# Patient Record
Sex: Female | Born: 1960 | Race: White | Hispanic: No | Marital: Married | Smoking: Never smoker
Health system: Southern US, Community
[De-identification: ages and names within clinical notes are randomized; demographics above are authoritative.]

## PROBLEM LIST (undated history)

## (undated) ENCOUNTER — Emergency Department (HOSPITAL_COMMUNITY): Discharge status: Against Medical Advice | Payer: Self-pay

## (undated) DIAGNOSIS — Z9889 Other specified postprocedural states: Secondary | ICD-10-CM

## (undated) DIAGNOSIS — Z8619 Personal history of other infectious and parasitic diseases: Secondary | ICD-10-CM

## (undated) DIAGNOSIS — R112 Nausea with vomiting, unspecified: Secondary | ICD-10-CM

## (undated) DIAGNOSIS — I10 Essential (primary) hypertension: Secondary | ICD-10-CM

## (undated) DIAGNOSIS — F419 Anxiety disorder, unspecified: Secondary | ICD-10-CM

## (undated) DIAGNOSIS — H8091 Unspecified otosclerosis, right ear: Secondary | ICD-10-CM

## (undated) HISTORY — DX: Anxiety disorder, unspecified: F41.9

## (undated) HISTORY — DX: Unspecified otosclerosis, right ear: H80.91

## (undated) HISTORY — DX: Essential (primary) hypertension: I10

## (undated) HISTORY — PX: TONSILLECTOMY AND ADENOIDECTOMY: SUR1326

## (undated) HISTORY — DX: Personal history of other infectious and parasitic diseases: Z86.19

---

## 1992-06-08 HISTORY — PX: OTHER SURGICAL HISTORY: SHX169

## 1995-06-09 HISTORY — PX: OTHER SURGICAL HISTORY: SHX169

## 1997-06-08 HISTORY — PX: CYSTECTOMY: SUR359

## 1997-09-28 ENCOUNTER — Ambulatory Visit (HOSPITAL_COMMUNITY): Admission: RE | Admit: 1997-09-28 | Discharge: 1997-09-28 | Payer: Self-pay | Admitting: Obstetrics and Gynecology

## 1997-12-12 ENCOUNTER — Observation Stay (HOSPITAL_COMMUNITY): Admission: RE | Admit: 1997-12-12 | Discharge: 1997-12-13 | Payer: Self-pay | Admitting: Obstetrics and Gynecology

## 1998-01-07 ENCOUNTER — Ambulatory Visit (HOSPITAL_COMMUNITY): Admission: RE | Admit: 1998-01-07 | Discharge: 1998-01-07 | Payer: Self-pay | Admitting: Obstetrics and Gynecology

## 1999-07-18 ENCOUNTER — Other Ambulatory Visit: Admission: RE | Admit: 1999-07-18 | Discharge: 1999-07-18 | Payer: Self-pay | Admitting: Obstetrics and Gynecology

## 2000-11-23 ENCOUNTER — Other Ambulatory Visit: Admission: RE | Admit: 2000-11-23 | Discharge: 2000-11-23 | Payer: Self-pay | Admitting: Obstetrics and Gynecology

## 2001-11-28 ENCOUNTER — Other Ambulatory Visit: Admission: RE | Admit: 2001-11-28 | Discharge: 2001-11-28 | Payer: Self-pay | Admitting: Obstetrics and Gynecology

## 2002-06-08 DIAGNOSIS — H8091 Unspecified otosclerosis, right ear: Secondary | ICD-10-CM

## 2002-06-08 HISTORY — DX: Unspecified otosclerosis, right ear: H80.91

## 2002-07-12 ENCOUNTER — Encounter: Payer: Self-pay | Admitting: Emergency Medicine

## 2002-07-12 ENCOUNTER — Emergency Department (HOSPITAL_COMMUNITY): Admission: EM | Admit: 2002-07-12 | Discharge: 2002-07-12 | Payer: Self-pay | Admitting: Internal Medicine

## 2002-12-19 ENCOUNTER — Other Ambulatory Visit: Admission: RE | Admit: 2002-12-19 | Discharge: 2002-12-19 | Payer: Self-pay | Admitting: Obstetrics and Gynecology

## 2004-04-23 ENCOUNTER — Other Ambulatory Visit: Admission: RE | Admit: 2004-04-23 | Discharge: 2004-04-23 | Payer: Self-pay | Admitting: Obstetrics and Gynecology

## 2005-06-22 ENCOUNTER — Other Ambulatory Visit: Admission: RE | Admit: 2005-06-22 | Discharge: 2005-06-22 | Payer: Self-pay | Admitting: Obstetrics and Gynecology

## 2006-06-23 ENCOUNTER — Other Ambulatory Visit: Admission: RE | Admit: 2006-06-23 | Discharge: 2006-06-23 | Payer: Self-pay | Admitting: Obstetrics and Gynecology

## 2007-12-21 ENCOUNTER — Other Ambulatory Visit: Admission: RE | Admit: 2007-12-21 | Discharge: 2007-12-21 | Payer: Self-pay | Admitting: Obstetrics and Gynecology

## 2012-10-07 ENCOUNTER — Encounter: Payer: Self-pay | Admitting: Gynecology

## 2012-10-07 ENCOUNTER — Telehealth: Payer: Self-pay | Admitting: Obstetrics and Gynecology

## 2012-10-07 ENCOUNTER — Encounter: Payer: Self-pay | Admitting: *Deleted

## 2012-10-07 ENCOUNTER — Ambulatory Visit (INDEPENDENT_AMBULATORY_CARE_PROVIDER_SITE_OTHER): Payer: 59 | Admitting: Gynecology

## 2012-10-07 VITALS — BP 118/78 | HR 68 | Resp 16 | Wt 156.6 lb

## 2012-10-07 DIAGNOSIS — N949 Unspecified condition associated with female genital organs and menstrual cycle: Secondary | ICD-10-CM

## 2012-10-07 DIAGNOSIS — Z9889 Other specified postprocedural states: Secondary | ICD-10-CM

## 2012-10-07 DIAGNOSIS — R1032 Left lower quadrant pain: Secondary | ICD-10-CM

## 2012-10-07 DIAGNOSIS — N938 Other specified abnormal uterine and vaginal bleeding: Secondary | ICD-10-CM

## 2012-10-07 DIAGNOSIS — Z86018 Personal history of other benign neoplasm: Secondary | ICD-10-CM | POA: Insufficient documentation

## 2012-10-07 NOTE — Progress Notes (Deleted)
Patient present today for pelvic pain having spotting since last week.

## 2012-10-07 NOTE — Telephone Encounter (Signed)
Call to pt's mobile number. No answer, not able to leave message. aa

## 2012-10-07 NOTE — Telephone Encounter (Signed)
Patient complains of lower left pelvic pain and wants appt.

## 2012-10-07 NOTE — Telephone Encounter (Signed)
Spoke with pt who has been having some lower left quadrant pelvic pain for 4-5 days. Pt on OCP "full time" and occasionally has breakthrough spotting that has been ongoing for her. Pt is a Runner, broadcasting/film/video with tight schedule. Sched OV with CR 10-10-12 at 1045 per pt request.

## 2012-10-07 NOTE — Progress Notes (Signed)
Subjective:     Patient ID: Ariel Nicholson, female   DOB: 1960-11-18, 52 y.o.   MRN: [id number]  HPI Comments: Pt reports left lower quadrant pain, no cramping, reports dull, waxing and waning, nonradiating.  Not sure about precipitating factors.No fever or chills.  Pain has been about a week.  No cange in bowels.  Pt is on Kariva nonstop but has started spotting brownish, painless also about 7d.  Pt con't ocp-kariva- approx 1.5y.  Pt reports BTB approx q88m.  Usually stops 5d, other episodes have not caused this pain.  Pelvic Pain The patient's pertinent negatives include no pelvic pain.     Review of Systems  Genitourinary: Negative for pelvic pain.  All other systems reviewed and are negative.       Objective:   Physical Exam BP 118/78  Pulse 68  Resp 16  Wt 156 lb 9.6 oz (71.033 kg)  LMP 10/07/2012 General appearance: alert, cooperative and appears stated age Abdomen: soft, non-tender; bowel sounds normal; no masses,  no organomegaly Pelvic: cervix normal in appearance, external genitalia normal, no bladder tenderness, no cervical motion tenderness, positive findings: tenderness of left adnexa(e), uterus normal size, shape, and consistency and vagina normal without discharge Minimal fullness on left-unable to reproduce pain/discomfort     Assessment:     LLQ pain, history of dermoid Neg u/a     Plan:     Urine culture plvic u/s to evaluate otc NSAIDS as indicated

## 2012-10-07 NOTE — Telephone Encounter (Signed)
LMTCB. Relayed that I could not leave a message on her cell.  aa

## 2012-10-09 LAB — CULTURE, URINE COMPREHENSIVE: Colony Count: NO GROWTH

## 2012-10-10 ENCOUNTER — Ambulatory Visit: Payer: Self-pay | Admitting: Obstetrics and Gynecology

## 2012-10-19 ENCOUNTER — Ambulatory Visit (INDEPENDENT_AMBULATORY_CARE_PROVIDER_SITE_OTHER): Payer: 59

## 2012-10-19 ENCOUNTER — Ambulatory Visit (INDEPENDENT_AMBULATORY_CARE_PROVIDER_SITE_OTHER): Payer: 59 | Admitting: Obstetrics and Gynecology

## 2012-10-19 ENCOUNTER — Encounter: Payer: Self-pay | Admitting: Obstetrics and Gynecology

## 2012-10-19 VITALS — BP 136/74

## 2012-10-19 DIAGNOSIS — R1032 Left lower quadrant pain: Secondary | ICD-10-CM

## 2012-10-19 DIAGNOSIS — N921 Excessive and frequent menstruation with irregular cycle: Secondary | ICD-10-CM

## 2012-10-19 DIAGNOSIS — D251 Intramural leiomyoma of uterus: Secondary | ICD-10-CM

## 2012-10-19 DIAGNOSIS — Z9889 Other specified postprocedural states: Secondary | ICD-10-CM

## 2012-10-19 DIAGNOSIS — N949 Unspecified condition associated with female genital organs and menstrual cycle: Secondary | ICD-10-CM

## 2012-10-19 DIAGNOSIS — N938 Other specified abnormal uterine and vaginal bleeding: Secondary | ICD-10-CM

## 2012-10-19 DIAGNOSIS — Z86018 Personal history of other benign neoplasm: Secondary | ICD-10-CM

## 2012-10-19 NOTE — Patient Instructions (Signed)
Please review your handout on uterine fibroids.

## 2012-10-19 NOTE — Progress Notes (Signed)
Subjective  Patient here for evaluation of LLQ pain.  History of a dermoid cyst.   Pain occurring off and on.  None today.  Coincided with spotting on OCPs, which is occurring more frequently.  Patient is taking OCPs continuously.  Indication for OCPs is cycle regulation.   Switched to Mircette due to an insurance change.  Skips the last week in the pack. Has menstrual headaches, actually the day prior to menses. Asking when menopause will occur.  Denies other symptoms of constipation, diarrhea, fevers, dysuria, and hematuria.  Objective  See ultrasound below.  2 small intramural fibroids noted.  Normal endometrium and ovaries.   Assessment  Breakthrough bleeding on continuous OCPs.   Two small uterine fibroids. LLQ pain with breakthrough bleeding.  Plan  Will continue with Mircette.  I discussed with patient that doing a withdrawal bleed every 6 weeks may reduce her breakthrough bleeding.  The down side would be a potential menstrual headache for her.   Will take Advil for LLQ pain. If irregular bleeding continues, consider endometrial biopsy. Has an appointment for annual exam with Dr. Tresa Res in July.

## 2013-01-06 ENCOUNTER — Ambulatory Visit (INDEPENDENT_AMBULATORY_CARE_PROVIDER_SITE_OTHER): Payer: 59 | Admitting: Obstetrics and Gynecology

## 2013-01-06 ENCOUNTER — Encounter: Payer: Self-pay | Admitting: Obstetrics and Gynecology

## 2013-01-06 VITALS — BP 122/80 | HR 78 | Resp 20 | Ht 67.0 in | Wt 152.0 lb

## 2013-01-06 DIAGNOSIS — Z0000 Encounter for general adult medical examination without abnormal findings: Secondary | ICD-10-CM

## 2013-01-06 DIAGNOSIS — Z01419 Encounter for gynecological examination (general) (routine) without abnormal findings: Secondary | ICD-10-CM

## 2013-01-06 LAB — POCT URINALYSIS DIPSTICK
Blood, UA: NEGATIVE
Ketones, UA: NEGATIVE
Protein, UA: NEGATIVE
pH, UA: 7

## 2013-01-06 LAB — HEMOGLOBIN, FINGERSTICK: Hemoglobin, fingerstick: 13.6 g/dL (ref 12.0–16.0)

## 2013-01-06 MED ORDER — DESOGESTREL-ETHINYL ESTRADIOL 0.15-0.02/0.01 MG (21/5) PO TABS: 1.0000 | ORAL_TABLET | Freq: Every day | ORAL | Status: DC

## 2013-01-06 MED ORDER — CITALOPRAM HYDROBROMIDE 20 MG PO TABS: 20.0000 mg | ORAL_TABLET | Freq: Every day | ORAL | Status: DC

## 2013-01-06 NOTE — Progress Notes (Signed)
52 y.o.   Married    Caucasian   female   [id number]   here for annual exam.    Patient's last menstrual period was 10/06/2012.          Sexually active: yes  The current method of family planning is vasectomy.    Exercising: walking 2-3 miles 6 days a week Last mammogram:  12/2012 normal Last pap smear: 12/27/09 neg History of abnormal pap: 10+ years ago Smoking: no Alcohol: 5-6 glasses of wine a week Last colonoscopy:never pt aware and will schedule.   Last Bone Density: never  Last tetanus shot:04/2004 Last cholesterol check: 01/04/12 normal  Hgb:  13.6              Urine: neg   Family History  Problem Relation Age of Onset  . Hypertension Mother   . Osteoporosis Mother   . Cancer Mother     pancreatic cancer  . Hypertension Father   . Heart disease Father   . Osteoporosis Maternal Grandmother     Patient Active Problem List   Diagnosis Date Noted  . History of dermoid cyst excision 10/07/2012    Past Medical History  Diagnosis Date  . Anxiety   . 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 Removal of norplant  1997  . Cystectomy  1999    dermiod cyst  . 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 Tonsillectomy and adenoidectomy      Allergies: Bactrim  Current Outpatient Prescriptions  Medication Sig Dispense Refill  . calcium carbonate (OS-CAL) 600 MG TABS Take 600 mg by mouth 2 (two) times daily with a meal.      . citalopram (CELEXA) 20 MG tablet Take 20 mg by mouth daily.      Marland Kitchen desogestrel-ethinyl estradiol (KARIVA,AZURETTE,MIRCETTE) 0.15-0.02/0.01 MG (21/5) tablet Take 1 tablet by mouth daily.      . fexofenadine (ALLEGRA) 180 MG tablet Take 180 mg by mouth as needed.       . fluticasone (FLONASE) 50 MCG/ACT nasal spray Place 2 sprays into the nose as needed.       . Multiple Vitamin (MULTIVITAMIN) capsule Take 1 capsule by mouth daily.       No current  facility-administered medications for this visit.    ROS: Pertinent items are noted in HPI.  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  Myriam Jacobson in [place] doing Psychologist, educational.    Exam:    BP 122/80  Pulse 78  Resp 20  Ht 5\' 7"  (1.702 m)  Wt 152 lb (68.947 kg)  BMI 23.8 kg/m2  LMP 05/01/2014ht and wt stable from last year   Wt Readings from Last 3 Encounters:  01/06/13 152 lb (68.947 kg)  10/07/12 156 lb 9.6 oz (71.033 kg)     Ht Readings from Last 3 Encounters:  01/06/13 5\' 7"  (1.702 m)    General appearance: alert, cooperative and appears stated age Head: Normocephalic, without obvious abnormality, atraumatic Neck: no adenopathy, supple, symmetrical, trachea midline and thyroid not enlarged, symmetric, no tenderness/mass/nodules Lungs: clear to auscultation bilaterally Breasts: Inspection negative, No nipple retraction or dimpling, No nipple discharge or bleeding, No axillary or supraclavicular adenopathy, Normal to palpation without dominant masses Heart: regular rate and rhythm Abdomen: soft, non-tender; bowel sounds normal;  no masses,  no organomegaly Extremities: extremities normal, atraumatic, no cyanosis or edema Skin: Skin color, texture, turgor normal. No rashes or lesions Lymph nodes: Cervical, supraclavicular, and axillary nodes normal. No abnormal inguinal nodes palpated Neurologic: Grossly normal   Pelvic: External genitalia:  no lesions              Urethra:  normal appearing urethra with no masses, tenderness or lesions              Bartholins and Skenes: normal                 Vagina: normal appearing vagina with normal color and discharge, no lesions              Cervix: normal appearance              Pap taken: yes        Bimanual Exam:  Uterus:  uterus is normal size, shape, consistency and nontender                                      Adnexa: normal adnexa in size, nontender and no masses                                       Rectovaginal: Confirms                                      Anus:  normal sphincter tone, no lesions  A: normal gyn exam, vasectomy     On continuous OC's for cycle control     Anxiety     S/p removal of dermoid cyst 1999     P:     mammogram pap smear counseled on breast self exam, mammography screening, adequate intake of calcium and vitamin D, diet and exercise return annually or prn     An After Visit Summary was printed and given to the patient.

## 2013-01-06 NOTE — Patient Instructions (Addendum)

## 2013-03-07 ENCOUNTER — Encounter: Payer: Self-pay | Admitting: Obstetrics and Gynecology

## 2013-04-10 ENCOUNTER — Encounter: Payer: Self-pay | Admitting: Nurse Practitioner

## 2013-04-10 ENCOUNTER — Ambulatory Visit (INDEPENDENT_AMBULATORY_CARE_PROVIDER_SITE_OTHER): Payer: 59 | Admitting: Nurse Practitioner

## 2013-04-10 ENCOUNTER — Telehealth: Payer: Self-pay | Admitting: Nurse Practitioner

## 2013-04-10 ENCOUNTER — Other Ambulatory Visit: Payer: Self-pay | Admitting: Internal Medicine

## 2013-04-10 VITALS — BP 140/92 | HR 76 | Ht 67.0 in | Wt 152.0 lb

## 2013-04-10 DIAGNOSIS — R109 Unspecified abdominal pain: Secondary | ICD-10-CM

## 2013-04-10 DIAGNOSIS — N951 Menopausal and female climacteric states: Secondary | ICD-10-CM

## 2013-04-10 MED ORDER — DESOGESTREL-ETHINYL ESTRADIOL 0.15-0.02/0.01 MG (21/5) PO TABS: 1.0000 | ORAL_TABLET | Freq: Every day | ORAL | Status: DC

## 2013-04-10 MED ORDER — MEDROXYPROGESTERONE ACETATE 10 MG PO TABS: 10.0000 mg | ORAL_TABLET | Freq: Every day | ORAL | Status: DC

## 2013-04-10 MED ORDER — ALPRAZOLAM 0.25 MG PO TABS: 0.2500 mg | ORAL_TABLET | Freq: Every evening | ORAL | Status: DC | PRN

## 2013-04-10 NOTE — Telephone Encounter (Signed)
Spoke with pt about anxiety, crying spells, hot flashes, joint pain and swelling, hair loss, slight dizziness, and occasional nausea. Pt has recently seen her PCP Dr. Eric Form who did a physical with EKG. No significant findings by PCP. Pt is most bothered by the anxiety. Pt off work today. Sched OV with PG at 4:15.

## 2013-04-10 NOTE — Progress Notes (Signed)
Patient ID: Ariel Nicholson, female   DOB: [birth year], 52 y.o.   MRN: [id number]  S:    52 yo WM Fe presents for a consult visit to discuss menopausal symptoms.  LMP 01/2013 after stopping continuous  active OCP.  Since then no cycle but she is having a lot of symptoms.  They include vaso symptoms, insomnia, brain fog, edgy, tearful, vaginal dryness and decreased libido.  The past 3-4 weeks have been worse with feeling  super anxious.  She had right chest wall pain and saw PCP.  Routine CXR was normal but she is going to do other evaluation for GB disease on Wednesday.   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  Other constitutional symptoms include nausea and dizzy at times.  She feels overwhelmed with her symptoms and wants to restart back on her OCP or do something!   O: Alert, oriented with good control of speech and voices her concerns.  When discussion of emotional symptoms she is tearful.  Denies any other stressors or family issues. No history of underlying depression.  Denies any thoughts of harm to herself or others.  She did take a piece of her husbands Xanax yesterday with some relief of anxiety.    A: Perimenopausal symptoms off OCP  AEX current on 01/06/13 with Dr. Arline Asp Romine  Mammogram normal on 12/27/12  Plan: Long discussion about peri and menopausal symptoms and effect on the body.  Booklet on Menopause is also provided.  At length discussed WHI study with potential side effects and risks of HRT.  She would like to proceed with Provera challenge to initiate a cycle then back on OCP - Mircette.  She is aware that if withdrawal bleeding is prolonged or if no bleeding to call us back.   We have spent time with discussion about all her concerns and she is given support.  Will follow as she goes through this process of restarting.  She is getting BP  monitored at PCP and it has been normal until today.  Will check with PCP on Wednesday as well.  She is given as short term RX for  Xanax 0.25 to take HS prn # 30 / no refills.  She is aware that no other RX for this will be given and if still needed will be done by PCP.

## 2013-04-10 NOTE — Patient Instructions (Signed)

## 2013-04-10 NOTE — Telephone Encounter (Signed)
Chief Complaint  Patient presents with   Appointment  pt is having some menopausal issues and would like to see Patty if possible.

## 2013-04-12 ENCOUNTER — Ambulatory Visit
Admission: RE | Admit: 2013-04-12 | Discharge: 2013-04-12 | Disposition: A | Discharge status: Home / Self Care | Payer: 59 | Source: Ambulatory Visit | Attending: Internal Medicine | Admitting: Internal Medicine

## 2013-04-12 DIAGNOSIS — R109 Unspecified abdominal pain: Secondary | ICD-10-CM

## 2013-04-12 NOTE — Progress Notes (Signed)
Encounter reviewed by Dr. Analiese Krupka Silva.  

## 2013-04-13 ENCOUNTER — Other Ambulatory Visit (HOSPITAL_COMMUNITY): Payer: Self-pay | Admitting: Internal Medicine

## 2013-04-13 DIAGNOSIS — R1011 Right upper quadrant pain: Secondary | ICD-10-CM

## 2013-04-20 ENCOUNTER — Other Ambulatory Visit (HOSPITAL_COMMUNITY): Payer: Self-pay | Admitting: Internal Medicine

## 2013-04-20 DIAGNOSIS — K8050 Calculus of bile duct without cholangitis or cholecystitis without obstruction: Secondary | ICD-10-CM

## 2013-04-20 DIAGNOSIS — R1011 Right upper quadrant pain: Secondary | ICD-10-CM

## 2013-04-21 ENCOUNTER — Encounter (HOSPITAL_COMMUNITY)
Admission: RE | Admit: 2013-04-21 | Discharge: 2013-04-21 | Disposition: A | Discharge status: Home / Self Care | Payer: 59 | Source: Ambulatory Visit | Attending: Internal Medicine | Admitting: Internal Medicine

## 2013-04-21 DIAGNOSIS — K8050 Calculus of bile duct without cholangitis or cholecystitis without obstruction: Secondary | ICD-10-CM

## 2013-04-21 DIAGNOSIS — K828 Other specified diseases of gallbladder: Secondary | ICD-10-CM | POA: Insufficient documentation

## 2013-04-21 DIAGNOSIS — R1011 Right upper quadrant pain: Secondary | ICD-10-CM | POA: Insufficient documentation

## 2013-04-21 MED ORDER — TECHNETIUM TC 99M MEBROFENIN IV KIT
5.0000 | PACK | Freq: Once | INTRAVENOUS | Status: AC | PRN
  Administered 2013-04-21: 5 via INTRAVENOUS

## 2013-04-21 MED ORDER — SINCALIDE 5 MCG IJ SOLR
INTRAMUSCULAR | Status: AC
  Administered 2013-04-21: 1.4 ug
  Filled 2013-04-21: qty 5

## 2013-04-21 MED ORDER — SINCALIDE 5 MCG IJ SOLR: 1.4000 ug | Freq: Once | INTRAMUSCULAR | Status: DC

## 2013-05-03 ENCOUNTER — Ambulatory Visit (INDEPENDENT_AMBULATORY_CARE_PROVIDER_SITE_OTHER): Payer: 59 | Admitting: Surgery

## 2013-05-03 ENCOUNTER — Telehealth: Payer: Self-pay | Admitting: Nurse Practitioner

## 2013-05-03 ENCOUNTER — Encounter (HOSPITAL_COMMUNITY)
Admission: RE | Admit: 2013-05-03 | Discharge: 2013-05-03 | Disposition: A | Discharge status: Home / Self Care | Payer: 59 | Source: Ambulatory Visit | Attending: Surgery | Admitting: Surgery

## 2013-05-03 ENCOUNTER — Encounter (HOSPITAL_COMMUNITY): Payer: Self-pay

## 2013-05-03 ENCOUNTER — Encounter (INDEPENDENT_AMBULATORY_CARE_PROVIDER_SITE_OTHER): Payer: Self-pay | Admitting: Surgery

## 2013-05-03 ENCOUNTER — Other Ambulatory Visit (HOSPITAL_COMMUNITY): Payer: Self-pay | Admitting: *Deleted

## 2013-05-03 VITALS — BP 128/78 | HR 72 | Temp 97.2°F | Resp 14 | Ht 67.0 in | Wt 150.0 lb

## 2013-05-03 DIAGNOSIS — K824 Cholesterolosis of gallbladder: Secondary | ICD-10-CM | POA: Insufficient documentation

## 2013-05-03 DIAGNOSIS — Z01818 Encounter for other preprocedural examination: Secondary | ICD-10-CM | POA: Insufficient documentation

## 2013-05-03 DIAGNOSIS — K828 Other specified diseases of gallbladder: Secondary | ICD-10-CM

## 2013-05-03 DIAGNOSIS — Z01812 Encounter for preprocedural laboratory examination: Secondary | ICD-10-CM | POA: Insufficient documentation

## 2013-05-03 HISTORY — DX: Other specified postprocedural states: Z98.890

## 2013-05-03 HISTORY — DX: Other specified postprocedural states: R11.2

## 2013-05-03 LAB — CBC
HCT: 41.3 % (ref 36.0–46.0)
MCH: 29.8 pg (ref 26.0–34.0)
MCHC: 33.4 g/dL (ref 30.0–36.0)
Platelets: 239 10*3/uL (ref 150–400)

## 2013-05-03 LAB — HCG, SERUM, QUALITATIVE: Preg, Serum: NEGATIVE

## 2013-05-03 NOTE — Patient Instructions (Signed)
20      Your procedure is scheduled on:  Friday 05/12/2013  Report to [facility] at  1245  AM.  Call this number if you have problems the night before or morning of surgery: [phone]   Remember:             IF YOU USE CPAP,BRING MASK AND TUBING AM OF SURGERY!             IF YOU DO NOT HAVE YOUR TYPE AND SCREEN DRAWN AT PRE-ADMIT APPOINTMENT, YOU WILL HAVE IT DRAWN AM OF SURGERY!   Do not eat food AFTER MIDNIGHT! MAY HAVE CLEAR LIQUIDS FROM MIDNIGHT UP UNTIL 0915 AM THEN NOTHING UNTIL AFTER SURGERY!  Take these medicines the morning of surgery with A SIP OF WATER: may use Flonase nasal spray, allegra if needed    [facility] IS NOT RESPONSIBLE FOR ANY BELONGINGS OR VALUABLES BROUGHT TO HOSPITAL.  Marland Kitchen  Leave suitcase in the car. After surgery it may be brought to your room.  For patients admitted to the hospital, checkout time is 11:00 AM the day of              Discharge.    DO NOT WEAR JEWELRY,MAKE-UP,LOTIONS,POWDERS,PERFUMES,CONTACTS , DENTURES OR BRIDGEWORK ,AND DO NOT WEAR FALSE EYELASHES                                    Patients discharged the day of surgery will not be allowed to drive home. If going home the same day of surgery, must have someone stay with you first 24 hrs.at home and arrange for someone to drive you home from the Hospital.                         YOUR DRIVER IS:   Special Instructions:              Please read over the following fact sheets that you were given:             1. [name]              2.INCENTIVE SPIROMETRY                                        [name],RN,BSN     [phone]                FAILURE TO FOLLOW THESE INSTRUCTIONS MAY RESULT IN  CANCELLATION OF YOUR SURGERY!               Patient Signature:___________________________

## 2013-05-03 NOTE — Patient Instructions (Signed)
  [facility]  LAPAROSCOPIC SURGERY - POST-OP INSTRUCTIONS  Always review your discharge instruction sheet given to you by the facility where your surgery was performed.  A prescription for pain medication may be given to you upon discharge.  Take your pain medication as prescribed.  If narcotic pain medicine is not needed, then you may take acetaminophen (Tylenol) or ibuprofen (Advil) as needed.  Take your usually prescribed medications unless otherwise directed.  If you need a refill on your pain medication, please contact your pharmacy.  They will contact our office to request authorization. Prescriptions will not be filled after 5 P.M. or on weekends.  You should follow a light diet the first few days after arrival home, such as soup and crackers or toast.  Be sure to include plenty of fluids daily.  Most patients will experience some swelling and bruising in the area of the incisions.  Ice packs will help.  Swelling and bruising can take several days to resolve.   It is common to experience some constipation if taking pain medication after surgery.  Increasing fluid intake and taking a stool softener (such as Colace) will usually help or prevent this problem from occurring.  A mild laxative (Milk of Magnesia or Miralax) should be taken according to package instructions if there are no bowel movements after 48 hours.  Unless discharge instructions indicate otherwise, you may remove your bandages 24-48 hours after surgery, and you may shower at that time.  You may have steri-strips (small skin tapes) in place directly over the incision.  These strips should be left on the skin for 7-10 days.  If your surgeon used skin glue on the incision, you may shower in 24 hours.  The glue will flake off over the next 2-3 weeks.  Any sutures or staples will be removed at the office during your follow-up visit.  ACTIVITIES:  You may resume regular (light) daily activities beginning the  next day-such as daily self-care, walking, climbing stairs-gradually increasing activities as tolerated.  You may have sexual intercourse when it is comfortable.  Refrain from any heavy lifting or straining until approved by your doctor.  You may drive when you are no longer taking prescription pain medication, you can comfortably wear a seatbelt, and you can safely maneuver your car and apply brakes.  You should see your doctor in the office for a follow-up appointment approximately 2-3 weeks after your surgery.  Make sure that you call for this appointment within a day or two after you arrive home to insure a convenient appointment time.  WHEN TO CALL YOUR DOCTOR: 1. Fever over 101.0 2. Inability to urinate 3. Continued bleeding from incision 4. Increased pain, redness, or drainage from the incision 5. Increasing abdominal pain  The clinic staff is available to answer your questions during regular business hours.  Please don't hesitate to call and ask to speak to one of the nurses for clinical concerns.  If you have a medical emergency, go to the nearest emergency room or call 911.  A surgeon from [facility] is always on call for the hospital.  Ariel Fok M. Reniah Cottingham, MD, FACS [facility] Office: [phone] Toll Free:  [phone] FAX [phone]  Web site: [email or web address] 

## 2013-05-03 NOTE — Progress Notes (Signed)
General Surgery - [facility]  Chief Complaint  Patient presents with  . New Evaluation    biliary dyskinesia, gallbladder polyp - referral from Dr. Doug Shaw, [facility]    HISTORY: Patient is a 52-year-old female referred by her primary care physician for evaluation for cholecystectomy for newly diagnosed gallbladder polyp and biliary dyskinesia.  Patient began having right upper quadrant abdominal pain in the late summer. She had an initial workup for pleuritic chest pain. Symptoms failed to resolve. An ultrasound showed an 8 mm gallbladder polyp but no gallstones. A nuclear medicine hepatobiliary scan was obtained which showed an abnormally low gallbladder ejection fraction of 1.6% on administration of cholecystokinin. Patient is now referred for evaluation for her gallbladder polyp and for biliary dyskinesia.  Patient experiences pain generally daily. She denies any significant nausea and has had no emesis. She denies fevers or chills. She denies jaundice or acholic stools. Her only prior abdominal surgery was for ovarian cyst. She denies any previous hepatobiliary or pancreatic disease.  Past Medical History  Diagnosis Date  . Anxiety   . 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 ALPRAZolam (XANAX) 0.25 MG tablet Take 1 tablet (0.25 mg total) by mouth at bedtime as needed for sleep.  30 tablet  0  . calcium carbonate (OS-CAL) 600 MG TABS Take 600 mg by mouth 2 (two) times daily with a meal.      . citalopram (CELEXA) 20 MG tablet Take 1 tablet (20 mg total) by mouth daily.  90 tablet  3  . diphenhydrAMINE (BENADRYL) 25 MG tablet Take 25 mg by mouth every 6 (six) hours as needed.      . fexofenadine (ALLEGRA) 180 MG tablet Take 180 mg by mouth as needed.       . fluticasone (FLONASE) 50 MCG/ACT nasal spray Place 2 sprays into the nose as  needed.       . Multiple Vitamin (MULTIVITAMIN) capsule Take 1 capsule by mouth daily.      . naproxen sodium (ANAPROX) 220 MG tablet Take 220 mg by mouth 2 (two) times daily with a meal.       No current facility-administered medications for this visit.    Allergies  Allergen Reactions  . Bactrim [Sulfamethoxazole-Trimethoprim] Hives    Family History  Problem Relation Age of Onset  . Hypertension Mother   . Osteoporosis Mother   . Cancer Mother     pancreatic cancer  . Hypertension Father   . Heart disease Father   . Osteoporosis Maternal Grandmother   . Cancer Maternal Grandfather     colon    History   Social History  . Marital Status: Married    Spouse Name: N/A    Number of Children: N/A  . Years of Education: N/A   Social History Main Topics  . Smoking status: Never Smoker   . Smokeless tobacco: Never Used  . Alcohol Use: 3.0 oz/week    6 drink(s) per week     Comment: 5-6 glasses of wine a week  . Drug Use: No  . Sexual Activity: Yes    Partners: Male    Birth Control/ Protection: Surgical     Comment: Husband with vasectomy   Other Topics Concern  . None   Social History Narrative  . None    REVIEW OF SYSTEMS -   PERTINENT POSITIVES ONLY: Intermittent right upper quadrant abdominal pain. Denies jaundice. Denies acholic stools.  EXAM: Filed Vitals:   05/03/13 0937  BP: 128/78  Pulse: 72  Temp: 97.2 F (36.2 C)  Resp: 14    HEENT: normocephalic; pupils equal and reactive; sclerae clear; dentition good; mucous membranes moist NECK:  symmetric on extension; no palpable anterior or posterior cervical lymphadenopathy; no supraclavicular masses; no tenderness CHEST: clear to auscultation bilaterally without rales, rhonchi, or wheezes CARDIAC: regular rate and rhythm without significant murmur; peripheral pulses are full ABDOMEN: soft without distension; bowel sounds present; no mass; no hepatosplenomegaly; no hernia EXT:  non-tender without edema;  no deformity NEURO: no gross focal deficits; no sign of tremor   LABORATORY RESULTS: See [facility] (CHL-Epic) for most recent results  RADIOLOGY RESULTS: See [facility] (CHL-Epic) for most recent results  IMPRESSION: #1 gallbladder polyp, 8 mm #2 biliary dyskinesia, intermittent abdominal pain  PLAN: I had a lengthy discussion with the patient and her husband. We discussed the above diagnoses at length. We reviewed her test results in detail. I provided him with written literature to review at home.  We discussed options for management. I have recommended laparoscopic cholecystectomy. We discussed the procedure at length as well as the potential complications. We discussed the hospital stay and her recovery following surgery. She and her husband understand and wish to proceed in the near future.  The risks and benefits of the procedure have been discussed at length with the patient.  The patient understands the proposed procedure, potential alternative treatments, and the course of recovery to be expected.  All of the patient's questions have been answered at this time.  The patient wishes to proceed with surgery.  Dartha Rozzell M. Rashed Edler, MD, FACS General & Endocrine Surgery [facility]  Primary Care Physician: SHAW,W DOUGLAS, MD   

## 2013-05-03 NOTE — Telephone Encounter (Signed)
LMTCB cm 

## 2013-05-03 NOTE — Telephone Encounter (Signed)
Patient was on provera for 10 days Ariel Nicholson told her to call after two weeks of being off of it to let her know if she started bleeding or not. She has NOT started bleeding.

## 2013-05-03 NOTE — Progress Notes (Signed)
Called patient's Primary Care Physician ,Dr. Clelia Croft and informed him of patient's BP at PST appointment today-160/118,173/113,176/105. Consulted Dr. Okey Dupre from Anesthesia and he wanted me to relate to PCP about patient's BP and Anesthesia would not do elective surgery on a patient with a BP like that and if any questions to call him.

## 2013-05-04 NOTE — Progress Notes (Signed)
Quick Note:  These results are acceptable for scheduled surgery.  Milam Allbaugh M. Taaj Hurlbut, MD, FACS [facility] Office: [phone]   ______ 

## 2013-05-09 ENCOUNTER — Other Ambulatory Visit: Payer: Self-pay | Admitting: Nurse Practitioner

## 2013-05-09 DIAGNOSIS — N912 Amenorrhea, unspecified: Secondary | ICD-10-CM

## 2013-05-09 NOTE — Telephone Encounter (Signed)
Have her schedule for [facility] at a month past her last dose of Provera.  Order is placed in epic.

## 2013-05-12 ENCOUNTER — Ambulatory Visit (HOSPITAL_COMMUNITY): Payer: 59

## 2013-05-12 ENCOUNTER — Encounter (HOSPITAL_COMMUNITY): Payer: Self-pay | Admitting: *Deleted

## 2013-05-12 ENCOUNTER — Encounter (HOSPITAL_COMMUNITY): Payer: 59 | Admitting: Anesthesiology

## 2013-05-12 ENCOUNTER — Ambulatory Visit (HOSPITAL_COMMUNITY): Payer: 59 | Admitting: Anesthesiology

## 2013-05-12 ENCOUNTER — Observation Stay (HOSPITAL_COMMUNITY)
Admission: RE | Admit: 2013-05-12 | Discharge: 2013-05-13 | Disposition: A | Discharge status: Home / Self Care | Payer: 59 | Source: Ambulatory Visit | Attending: Surgery | Admitting: Surgery

## 2013-05-12 ENCOUNTER — Encounter (HOSPITAL_COMMUNITY)
Admission: RE | Disposition: A | Discharge status: Home / Self Care | Payer: Self-pay | Source: Ambulatory Visit | Attending: Surgery

## 2013-05-12 DIAGNOSIS — Z79899 Other long term (current) drug therapy: Secondary | ICD-10-CM | POA: Insufficient documentation

## 2013-05-12 DIAGNOSIS — K811 Chronic cholecystitis: Secondary | ICD-10-CM

## 2013-05-12 DIAGNOSIS — K828 Other specified diseases of gallbladder: Secondary | ICD-10-CM | POA: Diagnosis present

## 2013-05-12 DIAGNOSIS — K824 Cholesterolosis of gallbladder: Secondary | ICD-10-CM

## 2013-05-12 HISTORY — PX: CHOLECYSTECTOMY: SHX55

## 2013-05-12 SURGERY — LAPAROSCOPIC CHOLECYSTECTOMY WITH INTRAOPERATIVE CHOLANGIOGRAM
Anesthesia: General | Site: Abdomen

## 2013-05-12 MED ORDER — ROCURONIUM BROMIDE 100 MG/10ML IV SOLN
INTRAVENOUS | Status: AC
  Filled 2013-05-12: qty 1

## 2013-05-12 MED ORDER — FENTANYL CITRATE 0.05 MG/ML IJ SOLN
INTRAMUSCULAR | Status: DC | PRN
  Administered 2013-05-12: 100 ug via INTRAVENOUS
  Administered 2013-05-12: 50 ug via INTRAVENOUS
  Administered 2013-05-12 (×2): 25 ug via INTRAVENOUS

## 2013-05-12 MED ORDER — GLYCOPYRROLATE 0.2 MG/ML IJ SOLN
INTRAMUSCULAR | Status: AC
  Filled 2013-05-12: qty 3

## 2013-05-12 MED ORDER — METOCLOPRAMIDE HCL 5 MG/ML IJ SOLN
INTRAMUSCULAR | Status: AC
  Filled 2013-05-12: qty 2

## 2013-05-12 MED ORDER — ONDANSETRON HCL 4 MG/2ML IJ SOLN: 4.0000 mg | Freq: Four times a day (QID) | INTRAMUSCULAR | Status: DC | PRN

## 2013-05-12 MED ORDER — IOHEXOL 300 MG/ML  SOLN
INTRAMUSCULAR | Status: DC | PRN
  Administered 2013-05-12: 6 mL via INTRAVENOUS

## 2013-05-12 MED ORDER — ONDANSETRON HCL 4 MG/2ML IJ SOLN
INTRAMUSCULAR | Status: AC
  Filled 2013-05-12: qty 2

## 2013-05-12 MED ORDER — ONDANSETRON HCL 4 MG/2ML IJ SOLN
INTRAMUSCULAR | Status: DC | PRN
  Administered 2013-05-12: 4 mg via INTRAVENOUS

## 2013-05-12 MED ORDER — DEXAMETHASONE SODIUM PHOSPHATE 10 MG/ML IJ SOLN
INTRAMUSCULAR | Status: AC
  Filled 2013-05-12: qty 1

## 2013-05-12 MED ORDER — LIDOCAINE HCL (CARDIAC) 20 MG/ML IV SOLN
INTRAVENOUS | Status: DC | PRN
  Administered 2013-05-12: 50 mg via INTRAVENOUS

## 2013-05-12 MED ORDER — PROPOFOL 10 MG/ML IV BOLUS
INTRAVENOUS | Status: DC | PRN
  Administered 2013-05-12: 170 mg via INTRAVENOUS

## 2013-05-12 MED ORDER — BUPIVACAINE-EPINEPHRINE 0.25% -1:200000 IJ SOLN
INTRAMUSCULAR | Status: DC | PRN
  Administered 2013-05-12 (×2): 10 mL

## 2013-05-12 MED ORDER — ROCURONIUM BROMIDE 100 MG/10ML IV SOLN
INTRAVENOUS | Status: DC | PRN
  Administered 2013-05-12: 50 mg via INTRAVENOUS
  Administered 2013-05-12: 10 mg via INTRAVENOUS

## 2013-05-12 MED ORDER — KCL IN DEXTROSE-NACL 20-5-0.45 MEQ/L-%-% IV SOLN
INTRAVENOUS | Status: DC
  Administered 2013-05-12: 16:00:00 via INTRAVENOUS
  Filled 2013-05-12 (×2): qty 1000

## 2013-05-12 MED ORDER — LACTATED RINGERS IV SOLN
INTRAVENOUS | Status: DC
  Administered 2013-05-12: 1000 mL via INTRAVENOUS
  Administered 2013-05-12: 13:00:00 via INTRAVENOUS

## 2013-05-12 MED ORDER — DEXAMETHASONE SODIUM PHOSPHATE 10 MG/ML IJ SOLN
INTRAMUSCULAR | Status: DC | PRN
  Administered 2013-05-12: 10 mg via INTRAVENOUS

## 2013-05-12 MED ORDER — CITALOPRAM HYDROBROMIDE 20 MG PO TABS
20.0000 mg | ORAL_TABLET | Freq: Every day | ORAL | Status: DC
  Administered 2013-05-12: 20 mg via ORAL
  Filled 2013-05-12 (×2): qty 1

## 2013-05-12 MED ORDER — HYDROMORPHONE HCL PF 1 MG/ML IJ SOLN
0.2500 mg | INTRAMUSCULAR | Status: DC | PRN
  Administered 2013-05-12 (×2): 0.5 mg via INTRAVENOUS

## 2013-05-12 MED ORDER — BUPIVACAINE-EPINEPHRINE 0.25% -1:200000 IJ SOLN
INTRAMUSCULAR | Status: AC
  Filled 2013-05-12: qty 1

## 2013-05-12 MED ORDER — NEOSTIGMINE METHYLSULFATE 1 MG/ML IJ SOLN
INTRAMUSCULAR | Status: AC
  Filled 2013-05-12: qty 10

## 2013-05-12 MED ORDER — PROPOFOL 10 MG/ML IV BOLUS
INTRAVENOUS | Status: AC
  Filled 2013-05-12: qty 20

## 2013-05-12 MED ORDER — ALPRAZOLAM 0.25 MG PO TABS: 0.2500 mg | ORAL_TABLET | Freq: Every evening | ORAL | Status: DC | PRN

## 2013-05-12 MED ORDER — CEFAZOLIN SODIUM-DEXTROSE 2-3 GM-% IV SOLR
2.0000 g | INTRAVENOUS | Status: AC
  Administered 2013-05-12: 2 g via INTRAVENOUS

## 2013-05-12 MED ORDER — EPHEDRINE SULFATE 50 MG/ML IJ SOLN
INTRAMUSCULAR | Status: DC | PRN
  Administered 2013-05-12: 7.5 mg via INTRAVENOUS

## 2013-05-12 MED ORDER — MIDAZOLAM HCL 5 MG/5ML IJ SOLN
INTRAMUSCULAR | Status: DC | PRN
  Administered 2013-05-12: 2 mg via INTRAVENOUS

## 2013-05-12 MED ORDER — NEOSTIGMINE METHYLSULFATE 1 MG/ML IJ SOLN
INTRAMUSCULAR | Status: DC | PRN
  Administered 2013-05-12: 4 mg via INTRAVENOUS

## 2013-05-12 MED ORDER — GLYCOPYRROLATE 0.2 MG/ML IJ SOLN
INTRAMUSCULAR | Status: DC | PRN
  Administered 2013-05-12: .6 mg via INTRAVENOUS

## 2013-05-12 MED ORDER — MIDAZOLAM HCL 2 MG/2ML IJ SOLN
INTRAMUSCULAR | Status: AC
  Filled 2013-05-12: qty 2

## 2013-05-12 MED ORDER — METOCLOPRAMIDE HCL 5 MG/ML IJ SOLN
INTRAMUSCULAR | Status: DC | PRN
  Administered 2013-05-12: 10 mg via INTRAVENOUS

## 2013-05-12 MED ORDER — FENTANYL CITRATE 0.05 MG/ML IJ SOLN
INTRAMUSCULAR | Status: AC
  Filled 2013-05-12: qty 2

## 2013-05-12 MED ORDER — LACTATED RINGERS IV SOLN: INTRAVENOUS | Status: DC

## 2013-05-12 MED ORDER — AMLODIPINE BESYLATE 5 MG PO TABS
5.0000 mg | ORAL_TABLET | Freq: Every day | ORAL | Status: DC
  Filled 2013-05-12: qty 1

## 2013-05-12 MED ORDER — ACETAMINOPHEN 325 MG PO TABS: 650.0000 mg | ORAL_TABLET | ORAL | Status: DC | PRN

## 2013-05-12 MED ORDER — HYDROMORPHONE HCL PF 1 MG/ML IJ SOLN
1.0000 mg | INTRAMUSCULAR | Status: DC | PRN
  Administered 2013-05-12: 1 mg via INTRAVENOUS
  Filled 2013-05-12: qty 1

## 2013-05-12 MED ORDER — CEFAZOLIN SODIUM-DEXTROSE 2-3 GM-% IV SOLR
INTRAVENOUS | Status: AC
  Filled 2013-05-12: qty 50

## 2013-05-12 MED ORDER — ONDANSETRON HCL 4 MG PO TABS: 4.0000 mg | ORAL_TABLET | Freq: Four times a day (QID) | ORAL | Status: DC | PRN

## 2013-05-12 MED ORDER — HYDROCODONE-ACETAMINOPHEN 5-325 MG PO TABS
1.0000 | ORAL_TABLET | ORAL | Status: DC | PRN
  Administered 2013-05-12: 2 via ORAL
  Filled 2013-05-12: qty 2

## 2013-05-12 MED ORDER — HYDROMORPHONE HCL PF 1 MG/ML IJ SOLN
INTRAMUSCULAR | Status: AC
  Filled 2013-05-12: qty 1

## 2013-05-12 SURGICAL SUPPLY — 36 items
APL SKNCLS STERI-STRIP NONHPOA (GAUZE/BANDAGES/DRESSINGS) ×1
APPLIER CLIP ROT 10 11.4 M/L (STAPLE) ×2
APR CLP MED LRG 11.4X10 (STAPLE) ×1
BAG SPEC RTRVL LRG 6X4 10 (ENDOMECHANICALS) ×1
BENZOIN TINCTURE PRP APPL 2/3 (GAUZE/BANDAGES/DRESSINGS) ×2 IMPLANT
CABLE HIGH FREQUENCY MONO STRZ (ELECTRODE) ×2 IMPLANT
CANISTER SUCTION 2500CC (MISCELLANEOUS) ×2 IMPLANT
CHLORAPREP W/TINT 26ML (MISCELLANEOUS) ×2 IMPLANT
CLIP APPLIE ROT 10 11.4 M/L (STAPLE) ×1 IMPLANT
COVER MAYO STAND STRL (DRAPES) ×3 IMPLANT
DECANTER SPIKE VIAL GLASS SM (MISCELLANEOUS) ×2 IMPLANT
DRAPE C-ARM 42X120 X-RAY (DRAPES) ×2 IMPLANT
DRAPE LAPAROSCOPIC ABDOMINAL (DRAPES) ×2 IMPLANT
DRAPE UTILITY XL STRL (DRAPES) ×2 IMPLANT
ELECT REM PT RETURN 9FT ADLT (ELECTROSURGICAL) ×2
ELECTRODE REM PT RTRN 9FT ADLT (ELECTROSURGICAL) ×1 IMPLANT
GLOVE SURG ORTHO 8.0 STRL STRW (GLOVE) ×2 IMPLANT
GOWN STRL REIN XL XLG (GOWN DISPOSABLE) ×5 IMPLANT
HEMOSTAT SURGICEL 4X8 (HEMOSTASIS) IMPLANT
KIT BASIN OR (CUSTOM PROCEDURE TRAY) ×2 IMPLANT
NS IRRIG 1000ML POUR BTL (IV SOLUTION) ×2 IMPLANT
POUCH SPECIMEN RETRIEVAL 10MM (ENDOMECHANICALS) ×2 IMPLANT
SCISSORS LAP 5X35 DISP (ENDOMECHANICALS) ×2 IMPLANT
SET CHOLANGIOGRAPH MIX (MISCELLANEOUS) ×2 IMPLANT
SET IRRIG TUBING LAPAROSCOPIC (IRRIGATION / IRRIGATOR) ×2 IMPLANT
SLEEVE XCEL OPT CAN 5 100 (ENDOMECHANICALS) ×2 IMPLANT
SOLUTION ANTI FOG 6CC (MISCELLANEOUS) ×2 IMPLANT
STRIP CLOSURE SKIN 1/2X4 (GAUZE/BANDAGES/DRESSINGS) ×2 IMPLANT
SUT MNCRL AB 4-0 PS2 18 (SUTURE) ×2 IMPLANT
TOWEL OR 17X26 10 PK STRL BLUE (TOWEL DISPOSABLE) ×2 IMPLANT
TOWEL OR NON WOVEN STRL DISP B (DISPOSABLE) ×2 IMPLANT
TRAY LAP CHOLE (CUSTOM PROCEDURE TRAY) ×2 IMPLANT
TROCAR BLADELESS OPT 5 100 (ENDOMECHANICALS) ×3 IMPLANT
TROCAR XCEL BLUNT TIP 100MML (ENDOMECHANICALS) ×2 IMPLANT
TROCAR XCEL NON-BLD 11X100MML (ENDOMECHANICALS) ×2 IMPLANT
TUBING INSUFFLATION 10FT LAP (TUBING) ×2 IMPLANT

## 2013-05-12 NOTE — Interval H&P Note (Signed)
History and Physical Interval Note:  05/12/2013 12:26 PM  Ariel Nicholson  has presented today for surgery, with the diagnosis of bilinary dyskensia, gallbadder polyp.  The various methods of treatment have been discussed with the patient and family. After consideration of risks, benefits and other options for treatment, the patient has consented to    Procedure(s): LAPAROSCOPIC CHOLECYSTECTOMY WITH INTRAOPERATIVE CHOLANGIOGRAM (N/A) as a surgical intervention .    The patient's history has been reviewed, patient examined, no change in status, stable for surgery.  I have reviewed the patient's chart and labs.  Questions were answered to the patient's satisfaction.    Velora Heckler, MD, [facility] Office: [phone]    Kamdin Follett Judie Petit

## 2013-05-12 NOTE — Anesthesia Postprocedure Evaluation (Signed)
  Anesthesia Post-op Note  Patient: Ariel Nicholson  Procedure(s) Performed: Procedure(s) (LRB): LAPAROSCOPIC CHOLECYSTECTOMY WITH INTRAOPERATIVE CHOLANGIOGRAM (N/A)  Patient Location: PACU  Anesthesia Type: General  Level of Consciousness: awake and alert   Airway and Oxygen Therapy: Patient Spontanous Breathing  Post-op Pain: mild  Post-op Assessment: Post-op Vital signs reviewed, Patient's Cardiovascular Status Stable, Respiratory Function Stable, Patent Airway and No signs of Nausea or vomiting  Last Vitals:  Filed Vitals:   05/12/13 1620  BP: 138/90  Pulse: 76  Temp: 36.5 C  Resp: 18    Post-op Vital Signs: stable   Complications: No apparent anesthesia complications

## 2013-05-12 NOTE — H&P (View-Only) (Signed)
General Surgery [facility]  Chief Complaint  Patient presents with  . New Evaluation    biliary dyskinesia, gallbladder polyp - referral from Dr. Eric Form, [facility]    HISTORY: Patient is a 52 year old female referred by her primary care physician for evaluation for cholecystectomy for newly diagnosed gallbladder polyp and biliary dyskinesia.  Patient began having right upper quadrant abdominal pain in the late summer. She had an initial workup for pleuritic chest pain. Symptoms failed to resolve. An ultrasound showed an 8 mm gallbladder polyp but no gallstones. A nuclear medicine hepatobiliary scan was obtained which showed an abnormally low gallbladder ejection fraction of 1.6% on administration of cholecystokinin. Patient is now referred for evaluation for her gallbladder polyp and for biliary dyskinesia.  Patient experiences pain generally daily. She denies any significant nausea and has had no emesis. She denies fevers or chills. She denies jaundice or acholic stools. Her only prior abdominal surgery was for ovarian cyst. She denies any previous hepatobiliary or pancreatic disease.  Past Medical History  Diagnosis Date  . Anxiety   . 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 ALPRAZolam (XANAX) 0.25 MG tablet Take 1 tablet (0.25 mg total) by mouth at bedtime as needed for sleep.  30 tablet  0  . calcium carbonate (OS-CAL) 600 MG TABS Take 600 mg by mouth 2 (two) times daily with a meal.      . citalopram (CELEXA) 20 MG tablet Take 1 tablet (20 mg total) by mouth daily.  90 tablet  3  . diphenhydrAMINE (BENADRYL) 25 MG tablet Take 25 mg by mouth every 6 (six) hours as needed.      . fexofenadine (ALLEGRA) 180 MG tablet Take 180 mg by mouth as needed.       . fluticasone (FLONASE) 50 MCG/ACT nasal spray Place 2 sprays into the nose as  needed.       . Multiple Vitamin (MULTIVITAMIN) capsule Take 1 capsule by mouth daily.      . naproxen sodium (ANAPROX) 220 MG tablet Take 220 mg by mouth 2 (two) times daily with a meal.       No current facility-administered medications for this visit.    Allergies  Allergen Reactions  . Bactrim [Sulfamethoxazole-Trimethoprim] Hives    Family History  Problem Relation Age of Onset  . Hypertension Mother   . Osteoporosis Mother   . Cancer Mother     pancreatic cancer  . Hypertension Father   . Heart disease Father   . Osteoporosis Maternal Grandmother   . Cancer Maternal Grandfather     colon    History   Social History  . Marital Status: Married    Spouse Name: N/A    Number of Children: N/A  . Years of Education: N/A   Social History Main Topics  . Smoking status: Never Smoker   . Smokeless tobacco: Never Used  . Alcohol Use: 3.0 oz/week    6 drink(s) per week     Comment: 5-6 glasses of wine a week  . Drug Use: No  . Sexual Activity: Yes    Partners: Male    Birth Control/ Protection: Surgical     Comment: Husband with vasectomy   Other Topics Concern  . None   Social History Narrative  . None    REVIEW OF SYSTEMS -  PERTINENT POSITIVES ONLY: Intermittent right upper quadrant abdominal pain. Denies jaundice. Denies acholic stools.  EXAM: Filed Vitals:   05/03/13 0937  BP: 128/78  Pulse: 72  Temp: 97.2 F (36.2 C)  Resp: 14    HEENT: normocephalic; pupils equal and reactive; sclerae clear; dentition good; mucous membranes moist NECK:  symmetric on extension; no palpable anterior or posterior cervical lymphadenopathy; no supraclavicular masses; no tenderness CHEST: clear to auscultation bilaterally without rales, rhonchi, or wheezes CARDIAC: regular rate and rhythm without significant murmur; peripheral pulses are full ABDOMEN: soft without distension; bowel sounds present; no mass; no hepatosplenomegaly; no hernia EXT:  non-tender without edema;  no deformity NEURO: no gross focal deficits; no sign of tremor   LABORATORY RESULTS: See [facility] (CHL-Epic) for most recent results  RADIOLOGY RESULTS: See [facility] (CHL-Epic) for most recent results  IMPRESSION: #1 gallbladder polyp, 8 mm #2 biliary dyskinesia, intermittent abdominal pain  PLAN: I had a lengthy discussion with the patient and her husband. We discussed the above diagnoses at length. We reviewed her test results in detail. I provided him with written literature to review at home.  We discussed options for management. I have recommended laparoscopic cholecystectomy. We discussed the procedure at length as well as the potential complications. We discussed the hospital stay and her recovery following surgery. She and her husband understand and wish to proceed in the near future.  The risks and benefits of the procedure have been discussed at length with the patient.  The patient understands the proposed procedure, potential alternative treatments, and the course of recovery to be expected.  All of the patient's questions have been answered at this time.  The patient wishes to proceed with surgery.  Velora Heckler, MD, FACS General & Endocrine Surgery [facility]  Primary Care Physician: Kari Baars, MD

## 2013-05-12 NOTE — Transfer of Care (Signed)
Immediate Anesthesia Transfer of Care Note  Patient: Ariel Nicholson  Procedure(s) Performed: Procedure(s): LAPAROSCOPIC CHOLECYSTECTOMY WITH INTRAOPERATIVE CHOLANGIOGRAM (N/A)  Patient Location: PACU  Anesthesia Type:General  Level of Consciousness: awake, alert , oriented and patient cooperative  Airway & Oxygen Therapy: Patient Spontanous Breathing and Patient connected to face mask oxygen  Post-op Assessment: Report given to PACU RN and Post -op Vital signs reviewed and stable  Post vital signs: Reviewed and stable  Complications: No apparent anesthesia complications

## 2013-05-12 NOTE — Anesthesia Preprocedure Evaluation (Addendum)
Anesthesia Evaluation  Patient identified by MRN, date of birth, ID band Patient awake    Reviewed: Allergy & Precautions, H&P , NPO status , Patient's Chart, lab work & pertinent test results  History of Anesthesia Complications (+) PONV  Airway Mallampati: II TM Distance: >3 FB Neck ROM: full    Dental  (+) Teeth Intact and Dental Advisory Given   Pulmonary neg pulmonary ROS,  breath sounds clear to auscultation  Pulmonary exam normal       Cardiovascular Exercise Tolerance: Good negative cardio ROS  Rhythm:regular Rate:Normal     Neuro/Psych negative neurological ROS  negative psych ROS   GI/Hepatic negative GI ROS, Neg liver ROS,   Endo/Other  negative endocrine ROS  Renal/GU negative Renal ROS  negative genitourinary   Musculoskeletal   Abdominal   Peds  Hematology negative hematology ROS (+)   Anesthesia Other Findings   Reproductive/Obstetrics negative OB ROS                          Anesthesia Physical Anesthesia Plan  ASA: II  Anesthesia Plan: General   Post-op Pain Management:    Induction: Intravenous  Airway Management Planned: Oral ETT  Additional Equipment:   Intra-op Plan:   Post-operative Plan: Extubation in OR  Informed Consent: I have reviewed the patients History and Physical, chart, labs and discussed the procedure including the risks, benefits and alternatives for the proposed anesthesia with the patient or authorized representative who has indicated his/her understanding and acceptance.   Dental Advisory Given  Plan Discussed with: CRNA and Surgeon  Anesthesia Plan Comments:         Anesthesia Quick Evaluation

## 2013-05-12 NOTE — Op Note (Signed)
Procedure Note  Pre-operative Diagnosis:  Biliary dyskinesia, gallbladder polyp  Post-operative Diagnosis:  same  Surgeon:  Velora Heckler, MD, FACS  Assistant:  none   Procedure:  Laparoscopic cholecystectomy with intra-operative cholangiography  Anesthesia:  General  Estimated Blood Loss:  minimal  Drains: none         Specimen: Gallbladder to pathology  Indications:  Patient is a 52 year old female referred by her primary care physician for evaluation for cholecystectomy for newly diagnosed gallbladder polyp and biliary dyskinesia. Patient began having right upper quadrant abdominal pain in the late summer. She had an initial workup for pleuritic chest pain. Symptoms failed to resolve. An ultrasound showed an 8 mm gallbladder polyp but no gallstones. A nuclear medicine hepatobiliary scan was obtained which showed an abnormally low gallbladder ejection fraction of 1.6% on administration of cholecystokinin. Patient now comes to surgery for cholecystectomy for gallbladder polyp and for biliary dyskinesia.   Procedure Details:  The patient was seen in the pre-op holding area. The risks, benefits, complications, treatment options, and expected outcomes have been discussed with the patient. The patient agreed with the proposed plan and signed the informed consent form.  The patient was taken to Operating Room, identified as Ariel Nicholson and the procedure verified as Laparoscopic Cholecystectomy with Intraoperative Cholangiogram. A "time out" was completed and the above information confirmed.  Following induction of general anesthesia, the patient was placed in the supine position. The abdomen was prepped and draped in the usual aseptic fashion.  An incision was made in the skin below the umbilicus. The midline fascia was incised and the peritoneal cavity entered and the Hasson canula was introduced under direct vision.  The Hasson canula was secured with a 0-Vicryl pursestring  suture. Pneumoperitoneum was established with carbon dioxide. Additional trocars were introduced under direct vision along the right costal margin in the midline, mid-clavicular line, and anterior axillary line.   The gallbladder was identified and the fundus grasped and retracted cephalad. Adhesions were taken down bluntly and the electrocautery was utilized as needed, taking care not to injure any adjacent structures. The infundibulum was grasped and retracted laterally, exposing the peritoneum overlying the triangle of Calot. The peritoneum was incised and structures exposed with blunt dissection. The cystic duct was clearly identified, bluntly dissected circumferentially, and clipped at the neck of the gallbladder.  An incision was made in the cystic duct and the cholangiogram catheter introduced. The catheter was secured using an ligaclip.  Real-time cholangiography was performed using C-arm fluoroscopy.  There was rapid filling of a normal caliber common bile duct.  There was reflux of contrast into the left and right hepatic ductal systems.  There was free flow distally into the duodenum without filling defect or obstruction.  Catheter was removed from the peritoneal cavity.  The cystic duct was then triply ligated with surgical clips and divided. The cystic artery was identified, dissected circumferentially, ligated with ligaclips, and divided.  The gallbladder was dissected away from the liver bed using the electrocautery for hemostasis. The gallbladder was completely removed from the liver and placed into an endocatch bag. The right upper quadrant was irrigated and the gallbladder bed was inspected. Hemostasis was achieved with the electrocautery. Warm saline irrigation was utilized until clear.  Pneumoperitoneum was released after viewing removal of the trocars with good hemostasis noted. The umbilical wound was irrigated and the fascia was then closed with the pursestring suture.  Local  anesthetic was infiltrated at all port sites. The skin incisions were  closed with 4-0 Monocril subcuticular sutures and steri-strips and dressings were applied.  Instrument, sponge, and needle counts were correct at the conclusion of the case.  The patient was awakened from anesthesia and brought to the recovery room in stable condition.  The patient tolerated the procedure well.   Velora Heckler, MD, [facility] Office: [phone]

## 2013-05-13 MED ORDER — HYDROCODONE-ACETAMINOPHEN 5-325 MG PO TABS: 1.0000 | ORAL_TABLET | Freq: Four times a day (QID) | ORAL | Status: DC | PRN

## 2013-05-13 NOTE — Progress Notes (Signed)
Patient IV clotted off.  Patient with no nausea expected discharge today.  Patient would prefer not to have another iv stick at this time.

## 2013-05-13 NOTE — Discharge Summary (Signed)
Physician Discharge Summary  Patient ID:  Ariel Nicholson  MRN: [id number]  DOB/AGE: March 31, 1961 52 y.o.  Admit date: 05/12/2013 Discharge date: 05/13/2013  Discharge Diagnoses:   Principal Problem:   Biliary dyskinesia Active Problems:   Gallbladder polyp   Operation: Procedure(s): LAPAROSCOPIC CHOLECYSTECTOMY WITH INTRAOPERATIVE CHOLANGIOGRAM on 05/12/2013 - T. Gerkin  Discharged Condition: good  Hospital Course: Ariel Nicholson is an 52 y.o. female whose primary care physician is Kari Baars, MD and who was admitted 05/12/2013 with a chief complaint of biliary dyskensia.   She was brought to the operating room on 05/12/2013 and underwent  LAPAROSCOPIC CHOLECYSTECTOMY WITH INTRAOPERATIVE CHOLANGIOGRAM.  She is one day post op. She is doing well.  She is tolerated a diet.  Her husband is in the room and she is ready to go home.   The discharge instructions were reviewed with the patient.  Consults: None  Significant Diagnostic Studies: Results for orders placed during the hospital encounter of 05/03/13  CBC      Result Value Range   WBC 5.3  4.0 - 10.5 K/uL   RBC 4.63  3.87 - 5.11 MIL/uL   Hemoglobin 13.8  12.0 - 15.0 g/dL   HCT 34.7  42.5 - 95.6 %   MCV 89.2  78.0 - 100.0 fL   MCH 29.8  26.0 - 34.0 pg   MCHC 33.4  30.0 - 36.0 g/dL   RDW 38.7  56.4 - 33.2 %   Platelets 239  150 - 400 K/uL  HCG, SERUM, QUALITATIVE      Result Value Range   Preg, Serum NEGATIVE  NEGATIVE    Dg Cholangiogram Operative  05/12/2013   CLINICAL DATA:  Diluted scarring to biliary dyskinesia  EXAM: INTRAOPERATIVE CHOLANGIOGRAM  TECHNIQUE: Cholangiographic images from the C-arm fluoroscopic device were submitted for interpretation post-operatively. Please see the procedural report for the amount of contrast and the fluoroscopy time utilized.  COMPARISON:  None.  FINDINGS: No persistent filling defects in the common duct. Intrahepatic ducts are incompletely visualized, appearing  decompressed centrally. Contrast passes into the duodenum.  : Negative for retained common duct stone.   Electronically Signed   By: Oley Balm M.D.   On: 05/12/2013 15:10   Nm Hepato W/eject Fract  04/21/2013   CLINICAL DATA:  Right upper quadrant discomfort an intolerance to fatty foods.  EXAM: NUCLEAR MEDICINE HEPATOBILIARY IMAGING WITH GALLBLADDER EF  TECHNIQUE: Sequential images of the abdomen were obtained out to 60 minutes following intravenous administration of radiopharmaceutical. After slow intravenous infusion of 1.24micrograms Cholecystokinin, gallbladder ejection fraction was determined. At 30 min, normal ejection fraction is greater than 30%.  COMPARISON:  Abdominal ultrasound dated April 12, 2013.  RADIOPHARMACEUTICALS:  83mCiTc-35m Choletec  FINDINGS: There is adequate uptake of the radiopharmaceutical by the liver. There is prompt visualization of the common bile duct and gallbladder. Bowel activity is visible by 25 min.  Following infusion of CCK the patient experienced no symptoms. However, the 30 min ejection fraction was very low at 1.6%.  IMPRESSION: 1. There is a normal pattern of uptake within the liver and excretion into the gallbladder and bowel. 2. Gallbladder dysfunction is present with a very low ejection fraction of 1.6% following infusion of CCK.   Electronically Signed   By: Mckale Haffey  [place]   On: 04/21/2013 09:30    Discharge Exam:  Filed Vitals:   05/13/13 1000  BP: 139/79  Pulse: 77  Temp: 98.1 F (36.7 C)  Resp: 18    General: WN WF  who is alert and generally healthy appearing.  Lungs: Clear to auscultation and symmetric breath sounds. Heart:  RRR. No murmur or rub. Abdomen: Soft. No mass.  Normal bowel sounds.  Bandages look good.  Discharge Medications:     Medication List         ALPRAZolam 0.25 MG tablet  Commonly known as:  XANAX  Take 1 tablet (0.25 mg total) by mouth at bedtime as needed for sleep.     amLODipine 5 MG tablet  Commonly  known as:  NORVASC  Take 5 mg by mouth daily.     calcium carbonate 600 MG Tabs tablet  Commonly known as:  OS-CAL  Take 600 mg by mouth 2 (two) times daily with a meal.     citalopram 20 MG tablet  Commonly known as:  CELEXA  Take 1 tablet (20 mg total) by mouth daily.     diphenhydrAMINE 25 MG tablet  Commonly known as:  BENADRYL  Take 25 mg by mouth at bedtime.     fexofenadine 180 MG tablet  Commonly known as:  ALLEGRA  Take 180 mg by mouth as needed.     fluticasone 50 MCG/ACT nasal spray  Commonly known as:  FLONASE  Place 2 sprays into the nose as needed.     HYDROcodone-acetaminophen 5-325 MG per tablet  Commonly known as:  NORCO/VICODIN  Take 1-2 tablets by mouth every 6 (six) hours as needed for moderate pain.     multivitamin capsule  Take 1 capsule by mouth daily.     naproxen sodium 220 MG tablet  Commonly known as:  ANAPROX  Take 220 mg by mouth 2 (two) times daily with a meal.        Disposition: ED Dismiss - Never Arrived      Discharge Orders   Future Appointments Provider Department Dept Phone   06/12/2013 11:15 AM Velora Heckler, MD [facility], [place] [phone]   01/09/2014 3:00 PM Lauro Franklin, FNP [facility] [phone]   Future Orders Complete By Expires   Diet - low sodium heart healthy  As directed    Increase activity slowly  As directed       Follow-up Information   Follow up with Velora Heckler, MD. Schedule an appointment as soon as possible for a visit in 3 weeks.   Specialty:  General Surgery   Contact information:   [address] [place] [address] [phone]      Return to work on:  After 2 weeks.  Activity:  Driving - May drive in 2 or 3 days, if doing well.   Lifting - No lifting > 15 pounds x 5 days, then no limit.  Wound Care:   May shower tomorrow.  Diet:  As tolerated.  Follow up appointment:  Call Dr. Ardine Eng office [facility]) at [phone]  for an appointment in 2 to 3 weeks.  Medications and dosages:  Resume your home medications.  You have a prescription for:  Vicodin   Signed: Ovidio Kin, M.D., [facility]  Surgery Office:  [phone]  05/13/2013, 11:04 AM

## 2013-05-13 NOTE — Progress Notes (Signed)
Utilization Review completed.  

## 2013-05-13 NOTE — Progress Notes (Signed)
Assessment unchanged. Pt and husband verbalized understanding of dc instructions through teach back. Scripts x 1 given by MD. Introduced to My Chart with plans to sign up. Discharged via wc to front entrance to meet awaiting vehicle to carry home. Accompanied by NT and husband.

## 2013-05-15 ENCOUNTER — Encounter (HOSPITAL_COMMUNITY): Payer: Self-pay | Admitting: Surgery

## 2013-05-15 NOTE — Telephone Encounter (Signed)
LMTCB for lab appt.  aa 

## 2013-05-16 ENCOUNTER — Other Ambulatory Visit: Payer: Self-pay | Admitting: Orthopedic Surgery

## 2013-05-16 NOTE — Telephone Encounter (Signed)
Spoke with pt for lab appt for [facility]. Pt stopped provera approx 04-20-13. Sched appt 05-19-13 at 11:40 per pt request.

## 2013-05-19 ENCOUNTER — Other Ambulatory Visit (INDEPENDENT_AMBULATORY_CARE_PROVIDER_SITE_OTHER): Payer: 59

## 2013-05-19 DIAGNOSIS — N912 Amenorrhea, unspecified: Secondary | ICD-10-CM

## 2013-05-19 LAB — FOLLICLE STIMULATING HORMONE: FSH: 72.4 m[IU]/mL

## 2013-05-22 ENCOUNTER — Encounter: Payer: Self-pay | Admitting: Obstetrics and Gynecology

## 2013-05-24 ENCOUNTER — Telehealth: Payer: Self-pay | Admitting: Emergency Medicine

## 2013-05-24 NOTE — Telephone Encounter (Signed)
Spoke with patient. She would like to go forward with HRT therapy. Last week, had gallbladder removed and was started on BP medication and never has had these issues in the past and feels it is r/t menopause. Having joint pains in morning and feels that her joint stiffness is r/t menopause as well. She would like to begin hormone therapy as soon as she can, declines trying otc methods first. . Advised patient I would send request to Lauro Franklin, FNP for review.   Patient will also check with insurance company to see what medications may be covered under her plan.

## 2013-05-24 NOTE — Telephone Encounter (Signed)
Message copied by Logyn Kendrick, Marc Morgans on Wed May 24, 2013 10:40 AM ------      Message from: Ria Comment R      Created: Mon May 22, 2013  8:43 AM       [facility] shows she is in menopause and do not expect another menses.  However if she does bleed will need to call us.  We have already discussed WHI Study and potential starting HRT.  How are her symptoms and how does she want to proceed?  She may use OTC Jeffie Pollock is she desires to see how she does. ------

## 2013-05-25 NOTE — Telephone Encounter (Signed)
Appointment scheduled.

## 2013-05-25 NOTE — Telephone Encounter (Signed)
Lets do a consult visit with her to review HRT choices and risk.

## 2013-05-25 NOTE — Telephone Encounter (Signed)
Pt calling with a update concerning hormone therapy prices Tier 3 for Combi patch

## 2013-05-26 ENCOUNTER — Ambulatory Visit (INDEPENDENT_AMBULATORY_CARE_PROVIDER_SITE_OTHER): Payer: 59 | Admitting: Nurse Practitioner

## 2013-05-26 ENCOUNTER — Encounter: Payer: Self-pay | Admitting: Nurse Practitioner

## 2013-05-26 VITALS — BP 136/88 | HR 80 | Ht 67.0 in | Wt 149.0 lb

## 2013-05-26 DIAGNOSIS — N959 Unspecified menopausal and perimenopausal disorder: Secondary | ICD-10-CM

## 2013-05-26 MED ORDER — MEDROXYPROGESTERONE ACETATE 10 MG PO TABS: 10.0000 mg | ORAL_TABLET | Freq: Every day | ORAL | Status: DC

## 2013-05-26 MED ORDER — ESTRADIOL 0.5 MG PO TABS: 0.5000 mg | ORAL_TABLET | Freq: Every day | ORAL | Status: DC

## 2013-05-26 NOTE — Progress Notes (Signed)
Subjective:     Patient ID: Ariel Nicholson, female   DOB: 11/09/1960, 52 y.o.   MRN: [id number]  HPI  This 52 yo WM Fe presents to discuss the best HRT for her to initiate.  She was here for a consult and we have already discussed WHI study with potential benefits and risk of HRT.  She now has contacted her insurance company and the HRT she prefers - the Combi Patch is tier 3.  Since last here she also has had lap chole. So she is now up and around without pain.  She has also tried OTC Estroven without help.   Her LMP 01/10/13 with amenorrhea since.  She has an increase in vaso symptoms over past 1-2 months. Now considering her best options.     Review of Systems  Constitutional: Negative for fever, chills and appetite change.  Respiratory: Negative.   Cardiovascular: Negative.   Gastrointestinal: Positive for abdominal pain.       Secondary to recent Lap Chole.  Endocrine: Positive for heat intolerance.  Genitourinary: Negative.   Musculoskeletal: Negative.   Skin: Negative.   Neurological: Negative.   Psychiatric/Behavioral: Negative.        Objective:   Physical Exam  Constitutional: She is oriented to person, place, and time. She appears well-developed and well-nourished. No distress.  No exam indicated for today  Neurological: She is alert and oriented to person, place, and time.  Psychiatric: She has a normal mood and affect. Her behavior is normal. Judgment and thought content normal.       Assessment:     Initiation of HRT    Plan:     Estradiol 0.5 mg daily for next 3 months. Provera 10 mg day 1-10 of each month starting in January 2015 She is given a perimenopausal menstrual chart with dates marked for each.  She will then give [name] a progress report in 3 months of any vaginal withdrawal bleeding.  If prolonged bleeding to also CB.   Again reviewed potential risk and benefits of HRT including DVT, CVA, cancer, etc.  ~15 minutes spent with patient >50% of time was in  face to face discussion of above.

## 2013-05-26 NOTE — Patient Instructions (Signed)

## 2013-05-26 NOTE — Progress Notes (Signed)
Reviewed personally.  M. Suzanne Sedra Morfin, MD.  

## 2013-06-05 ENCOUNTER — Telehealth: Payer: Self-pay | Admitting: Nurse Practitioner

## 2013-06-05 ENCOUNTER — Ambulatory Visit (INDEPENDENT_AMBULATORY_CARE_PROVIDER_SITE_OTHER): Payer: 59 | Admitting: Surgery

## 2013-06-05 ENCOUNTER — Encounter (INDEPENDENT_AMBULATORY_CARE_PROVIDER_SITE_OTHER): Payer: Self-pay | Admitting: Surgery

## 2013-06-05 VITALS — BP 124/62 | HR 77 | Temp 98.0°F | Resp 18 | Ht 67.0 in | Wt 149.6 lb

## 2013-06-05 DIAGNOSIS — K828 Other specified diseases of gallbladder: Secondary | ICD-10-CM

## 2013-06-05 DIAGNOSIS — Z1321 Encounter for screening for nutritional disorder: Secondary | ICD-10-CM

## 2013-06-05 DIAGNOSIS — K824 Cholesterolosis of gallbladder: Secondary | ICD-10-CM

## 2013-06-05 NOTE — Telephone Encounter (Signed)
Message left to return call to Teran Knittle at [phone].    

## 2013-06-05 NOTE — Progress Notes (Signed)
General Surgery [facility]  Chief Complaint  Patient presents with  . Routine Post Op    lap cholecystectomy 05/12/2013    HISTORY: Patient is a 52 year old female who underwent laparoscopic cholecystectomy on 05/12/2013 for biliary dyskinesia and all bladder polyp. Final pathology shows chronic cholecystitis and a 7 mm cholesterol polyp.  Postoperative course has been uneventful.  EXAM: Abdomen is soft without distention. His wounds are well-healed. No sign of herniation. Right upper quadrant is soft and nontender without mass.  IMPRESSION: Status post laparoscopic cholecystectomy  PLAN: Patient is released to full activity without restriction. Her diet is unrestricted. She will begin applying topical creams to her incisions.  Patient will return for surgical care as needed.  Velora Heckler, MD, FACS General & Endocrine Surgery [facility]   Visit Diagnoses: 1. Biliary dyskinesia   2. Gallbladder polyp

## 2013-06-05 NOTE — Telephone Encounter (Signed)
Pt has questions regarding the estrace medication.

## 2013-06-05 NOTE — Patient Instructions (Signed)
  COCOA BUTTER & VITAMIN E CREAM  (Palmer's or other brand)  Apply cocoa butter/vitamin E cream to your incision 2 - 3 times daily.  Massage cream into incision for one minute with each application.  Use sunscreen (50 SPF or higher) for first 6 months after surgery if area is exposed to sun.  You may substitute Mederma or other scar reducing creams as desired.   

## 2013-06-06 NOTE — Telephone Encounter (Signed)
Patient calling and states that she has been taking estradiol for 10 days now and still having joint pains and body aches. She is wondering if she needs to increase estrogen. Advised to wait until 14 full days of treatment to see how she is feeling. Advised would route message to Lauro Franklin, FNP and ensure there are no changes that need to be made now and how long she should wait. Patient agreeable.

## 2013-06-06 NOTE — Telephone Encounter (Signed)
It may take up to month to feel full effects.  But I wonder if her Vit D level has been checked.Maybe we should also check this.

## 2013-06-07 NOTE — Telephone Encounter (Signed)
Message from [place] given. Patient agreeable to plan. Lab appointment to check vitamin D scheduled.

## 2013-06-07 NOTE — Telephone Encounter (Signed)
Message left to return call to [place] at [phone].    

## 2013-06-09 ENCOUNTER — Other Ambulatory Visit (INDEPENDENT_AMBULATORY_CARE_PROVIDER_SITE_OTHER): Payer: 59

## 2013-06-09 DIAGNOSIS — E559 Vitamin D deficiency, unspecified: Secondary | ICD-10-CM

## 2013-06-09 DIAGNOSIS — Z1321 Encounter for screening for nutritional disorder: Secondary | ICD-10-CM

## 2013-06-09 NOTE — Telephone Encounter (Signed)
Thanks for talking to her - do  I need to put in order for Vit D?

## 2013-06-10 LAB — VITAMIN D 25 HYDROXY (VIT D DEFICIENCY, FRACTURES): Vit D, 25-Hydroxy: 41 ng/mL (ref 30–89)

## 2013-06-12 ENCOUNTER — Encounter (INDEPENDENT_AMBULATORY_CARE_PROVIDER_SITE_OTHER): Payer: 59 | Admitting: Surgery

## 2013-06-13 ENCOUNTER — Telehealth: Payer: Self-pay | Admitting: *Deleted

## 2013-06-13 NOTE — Telephone Encounter (Signed)
Message copied by Graylon Good on Tue Jun 13, 2013 10:42 AM ------      Message from: Kem Boroughs R      Created: Mon Jun 12, 2013  8:31 AM       Let patient know that Vit D was only a little low - but since she is having some joint and muscle pain I would suggest OTC Vit D at 1000 IU daily. ------

## 2013-06-13 NOTE — Telephone Encounter (Signed)
Pt notified of Vit D results in result note.

## 2013-06-13 NOTE — Telephone Encounter (Signed)
I have attempted to contact this patient by phone with the following results: left message to return my call on answering machine (mobile).  

## 2013-08-21 ENCOUNTER — Telehealth: Payer: Self-pay | Admitting: Nurse Practitioner

## 2013-08-21 NOTE — Telephone Encounter (Signed)
Retrun call to patient, VM confirms name, LMTCB.

## 2013-08-21 NOTE — Telephone Encounter (Signed)
Pt has noticed any changes after taking medication. Please call to advise what to do next.

## 2013-08-21 NOTE — Telephone Encounter (Signed)
Patient started HRT in January 2015 and reports not feeling any better. C/o joint pain/swelling and fatigue but did not bleed after Provera. Upon questioning feels hot flashes and night sweats which are slightly better. Thinks some hot flashes are related to anxiety. States feeling teary and not herself. Patient states "I am really not feeling any better".   Estradiol 0.5mg  and Provera first 10 days q month since March. Please advise.

## 2013-08-22 NOTE — Telephone Encounter (Signed)
Increase her Estradiol to 1 mg daily and continue with Provera day 1-10 monthly for 3 months then call back with a progress report.

## 2013-08-24 MED ORDER — ESTRADIOL 1 MG PO TABS: 1.0000 mg | ORAL_TABLET | Freq: Every day | ORAL | Status: DC

## 2013-08-24 MED ORDER — MEDROXYPROGESTERONE ACETATE 10 MG PO TABS: ORAL_TABLET | ORAL | Status: DC

## 2013-08-24 NOTE — Telephone Encounter (Signed)
Call to patient to notify of Patty's instruction. Apologized for delay in response time.  Patient will complete current RX with 2 tabs daily and then start new RX.States needs refills on Provera for next 3 months.  She will call back with update on symptoms and bleeding in 3 months.  RX sent.   Sent to Dr Sabra Heck for co-sign of RX since [facility] out.

## 2013-11-30 ENCOUNTER — Other Ambulatory Visit: Payer: Self-pay | Admitting: Nurse Practitioner

## 2013-12-01 NOTE — Telephone Encounter (Signed)
Last refilled: 08/24/13 Estradiol 1 mg #90/0 refills  Provera 10 mg #30/0 refills  Last AEX: 01/06/13 with Dr. Gates Rigg Scheduled: 01/09/14 with Ms. Patty Last mammogram: 12/27/12 Bi-Rads 1  S/w patient she says her symptoms have gotten a little better since she's increased her dosage.  Had one period in April every other month she hasn't had one since then. Advised her that we would go ahead and refill till her appointment in August and Ms. Patty and her can reassess then.  Estradiol 1 mg #90/0 refills Provera 10 mg #30/0 refills  Sent to [facility] patient is aware.  Routed to provider for review, encounter closed.

## 2014-01-09 ENCOUNTER — Encounter: Payer: Self-pay | Admitting: Nurse Practitioner

## 2014-01-09 ENCOUNTER — Ambulatory Visit (INDEPENDENT_AMBULATORY_CARE_PROVIDER_SITE_OTHER): Payer: 59 | Admitting: Nurse Practitioner

## 2014-01-09 ENCOUNTER — Encounter: Payer: Self-pay | Admitting: Obstetrics & Gynecology

## 2014-01-09 VITALS — BP 126/84 | HR 84 | Ht 67.5 in | Wt 148.0 lb

## 2014-01-09 DIAGNOSIS — Z0000 Encounter for general adult medical examination without abnormal findings: Secondary | ICD-10-CM

## 2014-01-09 DIAGNOSIS — Z01419 Encounter for gynecological examination (general) (routine) without abnormal findings: Secondary | ICD-10-CM

## 2014-01-09 DIAGNOSIS — Z1211 Encounter for screening for malignant neoplasm of colon: Secondary | ICD-10-CM

## 2014-01-09 LAB — POCT URINALYSIS DIPSTICK
Bilirubin, UA: NEGATIVE
GLUCOSE UA: NEGATIVE
Ketones, UA: NEGATIVE
Leukocytes, UA: NEGATIVE
NITRITE UA: NEGATIVE
PROTEIN UA: NEGATIVE
RBC UA: NEGATIVE
UROBILINOGEN UA: NEGATIVE
pH, UA: 5

## 2014-01-09 LAB — HEMOGLOBIN, FINGERSTICK: HEMOGLOBIN, FINGERSTICK: 13.4 g/dL (ref 12.0–16.0)

## 2014-01-09 MED ORDER — MEDROXYPROGESTERONE ACETATE 10 MG PO TABS: ORAL_TABLET | ORAL | Status: DC

## 2014-01-09 MED ORDER — ESTRADIOL 1 MG PO TABS: ORAL_TABLET | ORAL | Status: DC

## 2014-01-09 NOTE — Progress Notes (Signed)
Patient ID: Ariel Nicholson, female   DOB: Mar 18, 1961, 53 y.o.   MRN: [id number] 53 y.o. [id number] Married Caucasian Fe here for annual exam.  She was started on HRT 06/2013.  Since starting on Provera and Estradiol she had a menses 4/11 that lasted 4-5 days . Moderate to light, with associated HA's and PMS.  Previous menses before HRT was 01/10/13.  She again had very light spotting July 30 and 31 st. only with wiping.  Patient's last menstrual period was 09/16/2013.          Sexually active: Yes.    The current method of family planning is vasectomy.    Exercising: Yes.    walking 2-3 miles 6 times per week Smoker:  no  Health Maintenance: Pap: 01/06/2013 normal with Neg HR HPV MMG:  12/2013 Colonoscopy:  never TDaP:  08/21/05 Labs: HB:  13.4  Urine:  Negative    reports that she has never smoked. She has never used smokeless tobacco. She reports that she drinks about 3.5 ounces of alcohol per week. She reports that she does not use illicit drugs.  Past Medical History  Diagnosis Date  . Anxiety   . 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 PONV (postoperative nausea and vomiting)     Past Surgical History  Procedure Laterality Date  . Removal of norplant  1997  . Cystectomy  1999    dermiod cyst  . 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 Tonsillectomy and adenoidectomy    . Cholecystectomy N/A 05/12/2013    Procedure: LAPAROSCOPIC CHOLECYSTECTOMY WITH INTRAOPERATIVE CHOLANGIOGRAM;  Surgeon: Earnstine Regal, MD;  Location: [facility];  Service: General;  Laterality: N/A;    Current Outpatient Prescriptions  Medication Sig Dispense Refill  . ALPRAZolam (XANAX) 0.25 MG tablet Take 1 tablet (0.25 mg total) by mouth at bedtime as needed for sleep.  30 tablet  0  . calcium carbonate (OS-CAL) 600 MG TABS Take 600 mg by mouth 2 (two) times daily with a meal.      . citalopram (CELEXA) 20 MG tablet Take 1 tablet (20 mg total) by mouth daily.  90 tablet  3   . diphenhydrAMINE (BENADRYL) 25 MG tablet Take 25 mg by mouth at bedtime.       Marland Kitchen estradiol (ESTRACE) 1 MG tablet take 1 tablet by mouth once daily  90 tablet  3  . fexofenadine (ALLEGRA) 180 MG tablet Take 180 mg by mouth as needed.       . fluticasone (FLONASE) 50 MCG/ACT nasal spray Place 2 sprays into the nose as needed.       . irbesartan (AVAPRO) 150 MG tablet Take 1 tablet by mouth daily.      . medroxyPROGESTERone (PROVERA) 10 MG tablet take 1 tablet by mouth once daily DAYS 1 THRU 10 EACH MONTH.  30 tablet  3  . Multiple Vitamin (MULTIVITAMIN) capsule Take 1 capsule by mouth daily.      . naproxen sodium (ANAPROX) 220 MG tablet Take 220 mg by mouth 2 (two) times daily with a meal.      . OVER THE COUNTER MEDICATION MegaRed Joint Care, one daily      . Wheat Dextrin (BENEFIBER DRINK MIX PO) Take by mouth daily.       No current facility-administered medications for this visit.    Family History  Problem Relation Age of Onset  .  Hypertension Mother   . Osteoporosis Mother   . Cancer Mother     pancreatic cancer  . Hypertension Father   . Heart disease Father   . Osteoporosis Maternal Grandmother   . Cancer Maternal Grandfather     colon    ROS:  Pertinent items are noted in HPI.  Otherwise, a comprehensive ROS was negative.  Exam:   BP 126/84  Pulse 84  Ht 5' 7.5" (1.715 m)  Wt 148 lb (67.132 kg)  BMI 22.82 kg/m2  LMP 09/16/2013 Height: 5' 7.5" (171.5 cm)  Ht Readings from Last 3 Encounters:  01/09/14 5' 7.5" (1.715 m)  06/05/13 5\' 7"  (1.702 m)  05/26/13 5\' 7"  (1.702 m)    General appearance: alert, cooperative and appears stated age Head: Normocephalic, without obvious abnormality, atraumatic Neck: no adenopathy, supple, symmetrical, trachea midline and thyroid normal to inspection and palpation Lungs: clear to auscultation bilaterally Breasts: normal appearance, no masses or tenderness Heart: regular rate and rhythm Abdomen: soft, non-tender; no masses,  no  organomegaly Extremities: extremities normal, atraumatic, no cyanosis or edema Skin: Skin color, texture, turgor normal. No rashes or lesions Lymph nodes: Cervical, supraclavicular, and axillary nodes normal. No abnormal inguinal nodes palpated Neurologic: Grossly normal   Pelvic: External genitalia:  no lesions              Urethra:  normal appearing urethra with no masses, tenderness or lesions              Bartholin's and Skene's: normal                 Vagina: normal appearing vagina with normal color and discharge, no lesions              Cervix: anteverted              Pap taken: No. Bimanual Exam:  Uterus:  normal size, contour, position, consistency, mobility, non-tender              Adnexa: no mass, fullness, tenderness               Rectovaginal: Confirms               Anus:  normal sphincter tone, no lesions  A:  Well Woman with normal exam  Postmenopausal on HRT since 06/2013  Screening for colon cancer - will get GI consult  P:   Reviewed health and wellness pertinent to exam  Pap smear not taken today  Mammogram due 7/16  She will return for fasting labs.  Referral fr GI consult  Refill on Estradiol 1 mg daily for a year  Refill on Provera 10 mg day 1-10 monthly  Counseled on risk of DVT, CVA, cancer, etc.  Counseled on breast self exam, mammography screening, use and side effects of HRT, adequate intake of calcium and vitamin D, diet and exercise, Kegel's exercises return annually or prn  An After Visit Summary was printed and given to the patient.  She is very appreciative of our care for her 2 daughters.

## 2014-01-09 NOTE — Patient Instructions (Addendum)

## 2014-01-12 ENCOUNTER — Other Ambulatory Visit (INDEPENDENT_AMBULATORY_CARE_PROVIDER_SITE_OTHER): Payer: 59

## 2014-01-12 DIAGNOSIS — Z0000 Encounter for general adult medical examination without abnormal findings: Secondary | ICD-10-CM

## 2014-01-12 LAB — LIPID PANEL
CHOL/HDL RATIO: 2.2 ratio
Cholesterol: 213 mg/dL — ABNORMAL HIGH (ref 0–200)
HDL: 96 mg/dL (ref >39)
LDL CALC: 103 mg/dL — AB (ref 0–99)
TRIGLYCERIDES: 69 mg/dL (ref <150)
VLDL: 14 mg/dL (ref 0–40)

## 2014-01-12 LAB — COMPREHENSIVE METABOLIC PANEL
ALK PHOS: 36 U/L — AB (ref 39–117)
ALT: 10 U/L (ref 0–35)
AST: 18 U/L (ref 0–37)
Albumin: 4.5 g/dL (ref 3.5–5.2)
BILIRUBIN TOTAL: 1.1 mg/dL (ref 0.2–1.2)
BUN: 13 mg/dL (ref 6–23)
CO2: 24 mEq/L (ref 19–32)
CREATININE: 0.87 mg/dL (ref 0.50–1.10)
Calcium: 9.2 mg/dL (ref 8.4–10.5)
Chloride: 104 mEq/L (ref 96–112)
GLUCOSE: 98 mg/dL (ref 70–99)
Potassium: 4.6 mEq/L (ref 3.5–5.3)
Sodium: 138 mEq/L (ref 135–145)
Total Protein: 6.6 g/dL (ref 6.0–8.3)

## 2014-01-12 LAB — VITAMIN D 25 HYDROXY (VIT D DEFICIENCY, FRACTURES): Vit D, 25-Hydroxy: 44 ng/mL (ref 30–89)

## 2014-01-12 LAB — TSH: TSH: 2.071 u[IU]/mL (ref 0.350–4.500)

## 2014-01-14 NOTE — Progress Notes (Signed)
Encounter reviewed by Dr. Allante Beane Silva.  

## 2014-01-29 ENCOUNTER — Telehealth: Payer: Self-pay | Admitting: Nurse Practitioner

## 2014-01-29 NOTE — Telephone Encounter (Signed)
Left message for patient to call back. Need to advise of appt with Dr Collene Mares 09.01.2015 @ 1030

## 2014-01-30 NOTE — Telephone Encounter (Signed)
Patient returning Sabrina's call. °

## 2014-02-05 ENCOUNTER — Other Ambulatory Visit: Payer: Self-pay

## 2014-02-05 MED ORDER — CITALOPRAM HYDROBROMIDE 20 MG PO TABS: 20.0000 mg | ORAL_TABLET | Freq: Every day | ORAL | Status: DC

## 2014-02-05 NOTE — Telephone Encounter (Signed)
Last AEX: 01/09/14 Last refill: 01/06/13 #90 X 3  Current AEX: 01/11/15  Please advise

## 2014-02-06 ENCOUNTER — Telehealth: Payer: Self-pay | Admitting: Nurse Practitioner

## 2014-02-06 NOTE — Telephone Encounter (Signed)
02/05/14 #90/3 refills was sent to [id number] [facility] [address] [facility] they do have rx on file and it is ready for the patient. Patient is aware.  Routed to provider for review, encounter closed.

## 2014-02-06 NOTE — Telephone Encounter (Signed)
Patient is asking for a refill of refill of generic celexa. Pharmacy on file.

## 2014-04-09 ENCOUNTER — Encounter: Payer: Self-pay | Admitting: Nurse Practitioner

## 2014-09-07 HISTORY — PX: STAPEDECTOMY: SHX2435

## 2015-01-11 ENCOUNTER — Ambulatory Visit: Payer: Self-pay | Admitting: Nurse Practitioner

## 2015-02-06 ENCOUNTER — Other Ambulatory Visit: Payer: Self-pay | Admitting: Nurse Practitioner

## 2015-02-07 NOTE — Telephone Encounter (Signed)
Medication refill request: Estradiol 1 mg ; Provera 10 mg  Last AEX:  01/09/14 with PG Next AEX: 03/11/2015 with PG Last MMG (if hormonal medication request): 01/01/2015 breast density category b; bi-rads 1: negative  Refill authorized: #90 and #30/1 rfs?  (routed to Ms. Debbie since Ms. Chong Sicilian is out of the office today)

## 2015-03-11 ENCOUNTER — Encounter: Payer: Self-pay | Admitting: Nurse Practitioner

## 2015-03-11 ENCOUNTER — Ambulatory Visit (INDEPENDENT_AMBULATORY_CARE_PROVIDER_SITE_OTHER): Payer: Managed Care, Other (non HMO) | Admitting: Nurse Practitioner

## 2015-03-11 VITALS — BP 114/66 | HR 76 | Ht 67.5 in | Wt 155.0 lb

## 2015-03-11 DIAGNOSIS — Z01419 Encounter for gynecological examination (general) (routine) without abnormal findings: Secondary | ICD-10-CM | POA: Diagnosis not present

## 2015-03-11 MED ORDER — ESTRADIOL 1 MG PO TABS: 1.0000 mg | ORAL_TABLET | Freq: Every day | ORAL | Status: DC

## 2015-03-11 MED ORDER — MEDROXYPROGESTERONE ACETATE 10 MG PO TABS: ORAL_TABLET | ORAL | Status: DC

## 2015-03-11 NOTE — Progress Notes (Signed)
Patient ID: Ariel Nicholson, female   DOB: 1960/10/24, 54 y.o.   MRN: [id number] 54 y.o. [id number] Married  Caucasian Fe here for annual exam. 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  Now after 3 months check up went well and does not have to wear hearing aid after 6 years. Doing well on HRT and wants to continue.  No vaginal dryness.  Patient's last menstrual period was 09/16/2013 (exact date).          Sexually active: Yes.    The current method of family planning is post menopausal status.    Exercising: Yes.    Home exercise routine includes walking 2.5-3 miles 2-3 times per week.  Also on feet walking all day at school. Smoker:  no  Health Maintenance: Pap: 01/06/2013, Negative with Neg HR HPV MMG: 01/01/15, Bi-Rads 1:  Negative Colonoscopy:  04/27/14, normal, repeat in 10 years TDaP: 12/30/14 Labs: HB: Dr. Brigitte Pulse, 8/2016Urine: Dr. Brigitte Pulse, 01/2015    reports that she has never smoked. She has never used smokeless tobacco. She reports that she drinks about 3.5 oz of alcohol per week. She reports that she does not use illicit drugs.  Past Medical History  Diagnosis Date  . Anxiety   . 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 PONV (postoperative nausea and vomiting)     Past Surgical History  Procedure Laterality Date  . Removal of norplant  1997  . Cystectomy  1999    dermiod cyst  . 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 Tonsillectomy and adenoidectomy    . Cholecystectomy N/A 05/12/2013    Procedure: LAPAROSCOPIC CHOLECYSTECTOMY WITH INTRAOPERATIVE CHOLANGIOGRAM;  Surgeon: Earnstine Regal, MD;  Location: [facility];  Service: General;  Laterality: N/A;  . Stapedectomy Right 09/2014    Current Outpatient Prescriptions  Medication Sig Dispense Refill  . calcium carbonate (OS-CAL) 600 MG TABS Take 600 mg by mouth 2 (two) times daily with a meal.    . estradiol (ESTRACE) 1 MG tablet Take 1 tablet (1 mg total) by  mouth daily. 90 tablet 3  . fexofenadine (ALLEGRA) 180 MG tablet Take 180 mg by mouth as needed.     . fluticasone (FLONASE) 50 MCG/ACT nasal spray Place 2 sprays into the nose as needed.     . irbesartan (AVAPRO) 150 MG tablet Take 1 tablet by mouth daily.    . medroxyPROGESTERone (PROVERA) 10 MG tablet take 1 tablet by mouth once daily (DAYS 1 THRU 10 OF EACH MONTH) 30 tablet 3  . Multiple Vitamin (MULTIVITAMIN) capsule Take 1 capsule by mouth daily.    Marland Kitchen OVER THE COUNTER MEDICATION MegaRed Joint Care, one daily    . Wheat Dextrin (BENEFIBER DRINK MIX PO) Take by mouth daily.     No current facility-administered medications for this visit.    Family History  Problem Relation Age of Onset  . Hypertension Mother   . Osteoporosis Mother   . Cancer Mother     pancreatic cancer  . Hypertension Father   . Heart disease Father   . Osteoporosis Maternal Grandmother   . Cancer Maternal Grandfather     colon    ROS:  Pertinent items are noted in HPI.  Otherwise, a comprehensive ROS was negative.  Exam:   BP 114/66 mmHg  Pulse 76  Ht 5' 7.5" (1.715 m)  Wt 155 lb (70.308 kg)  BMI  23.90 kg/m2  LMP 09/16/2013 (Exact Date) Height: 5' 7.5" (171.5 cm) Ht Readings from Last 3 Encounters:  03/11/15 5' 7.5" (1.715 m)  01/09/14 5' 7.5" (1.715 m)  06/05/13 5\' 7"  (1.702 m)    General appearance: alert, cooperative and appears stated age Head: Normocephalic, without obvious abnormality, atraumatic Neck: no adenopathy, supple, symmetrical, trachea midline and thyroid normal to inspection and palpation Lungs: clear to auscultation bilaterally Breasts: normal appearance, no masses or tenderness Heart: regular rate and rhythm Abdomen: soft, non-tender; no masses,  no organomegaly Extremities: extremities normal, atraumatic, no cyanosis or edema Skin: Skin color, texture, turgor normal. No rashes or lesions Lymph nodes: Cervical, supraclavicular, and axillary nodes normal. There is a small  lipoma kidney been size below right clavicle. No abnormal inguinal nodes palpated Neurologic: Grossly normal   Pelvic: External genitalia:  no lesions              Urethra:  normal appearing urethra with no masses, tenderness or lesions              Bartholin's and Skene's: normal                 Vagina: normal appearing vagina with normal color and discharge, no lesions              Cervix: anteverted              Pap taken: No. Bimanual Exam:  Uterus:  normal size, contour, position, consistency, mobility, non-tender              Adnexa: no mass, fullness, tenderness               Rectovaginal: Confirms               Anus:  normal sphincter tone, no lesions  Chaperone present: no  A:  Well Woman with normal exam  Postmenopausal on HRT since 06/2013  S/P Stapedectomy 09/2014 for Otosclerosis - with now no use of hearing aid after 6 years    P:   Reviewed health and wellness pertinent to exam  Pap smear as above  Mammogram is due 12/2015  Refill Estradiol 1 mg daily for a year, will discuss lowering dose next year - she is concerned because of the "crazies" she had prior to being on HRT  Refill  on Provera 10 mg day 1-10 monthly for a year  Counseled on risk of DVT, CVA, cancer, etc.  Counseled on breast self exam, mammography screening, use and side effects of HRT, adequate intake of calcium and vitamin D, diet and exercise, Kegel's exercises return annually or prn  An After Visit Summary was printed and given to the patient.

## 2015-03-11 NOTE — Patient Instructions (Signed)

## 2015-03-12 NOTE — Progress Notes (Signed)
Encounter reviewed Mitchell Iwanicki, MD   

## 2015-07-27 IMAGING — RF DG CHOLANGIOGRAM OPERATIVE
1 series · 8 of 8 positions shown · non-contrast
Comparison: None.

CLINICAL DATA: Diluted scarring to biliary dyskinesia

EXAM:
INTRAOPERATIVE CHOLANGIOGRAM
TECHNIQUE: Cholangiographic images from the C-arm fluoroscopic device were
submitted for interpretation post-operatively. Please see the
procedural report for the amount of contrast and the fluoroscopy
time utilized.

[Series 1: run · 2 acquisitions, 8 frames shown]
[im 1/2]
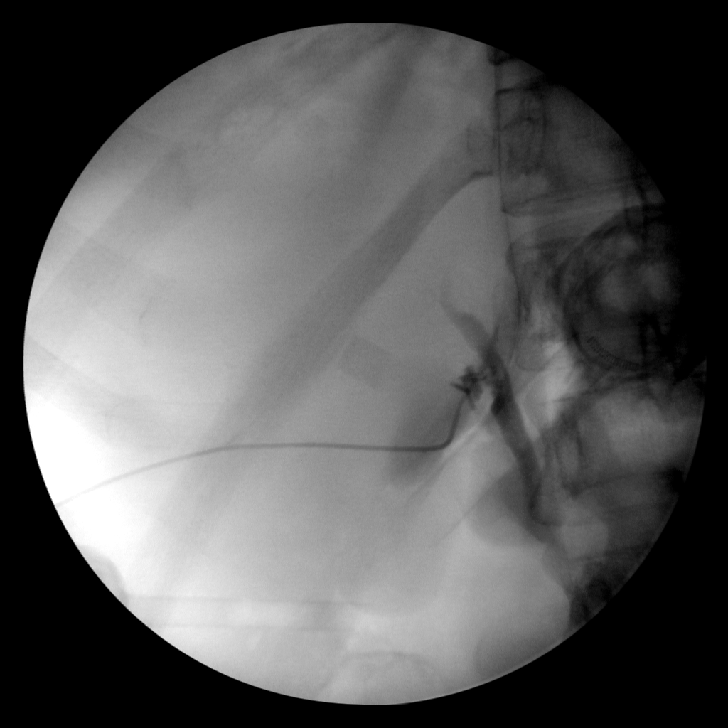
[im 1/2]
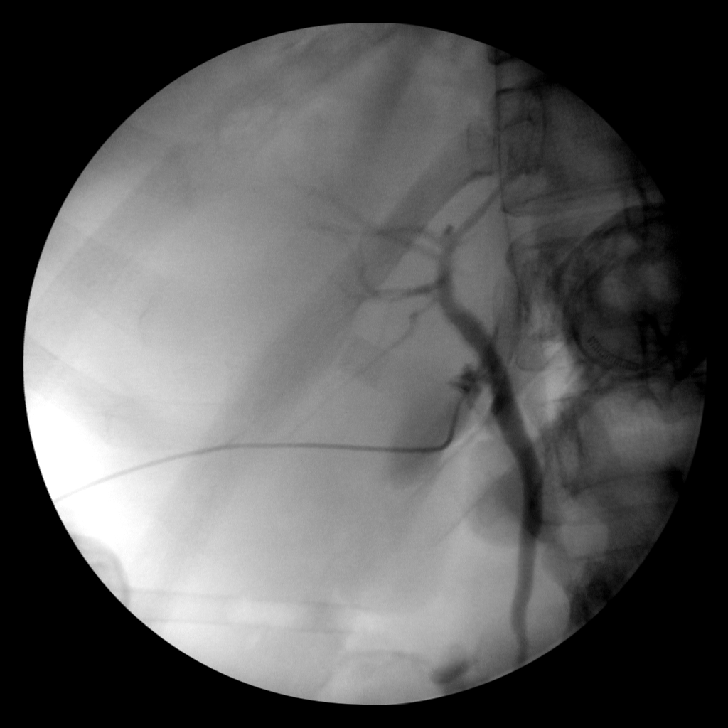
[im 1/2]
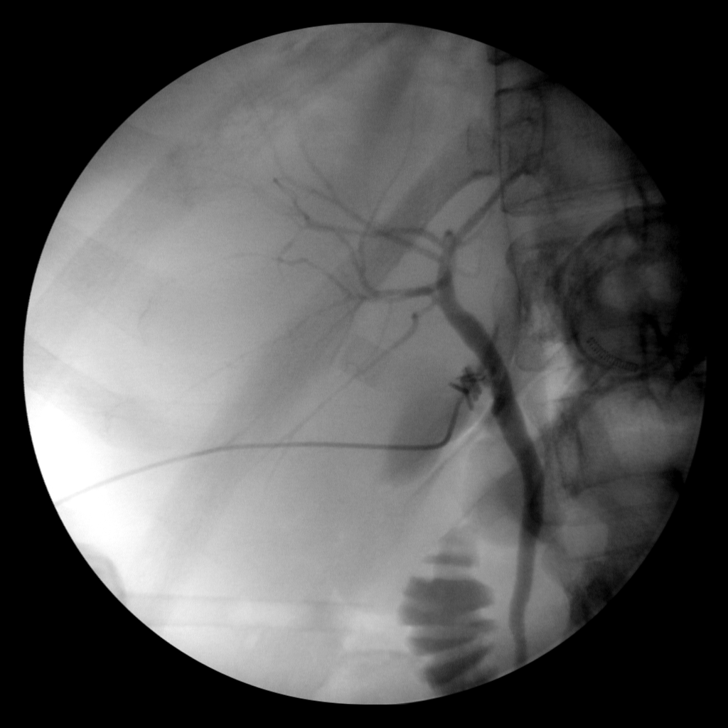
[im 1/2]
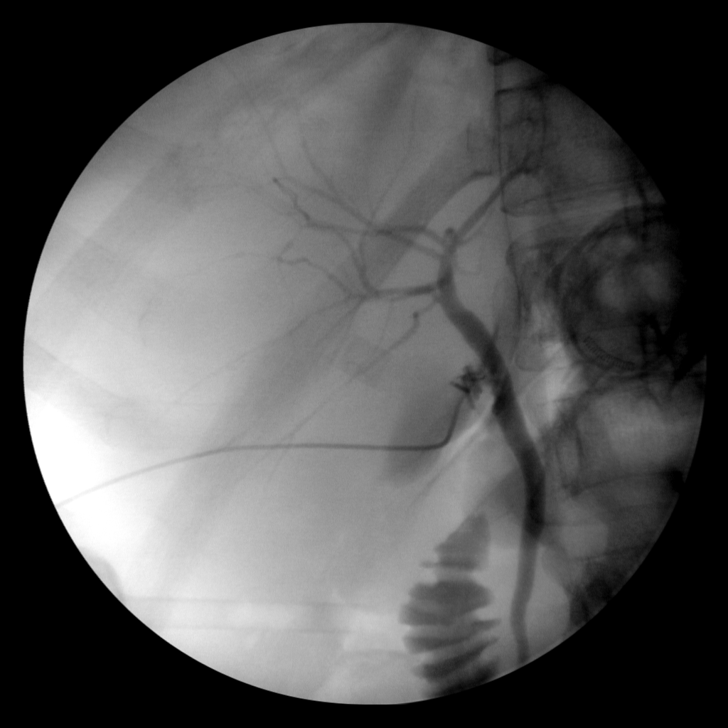
[im 2/2]
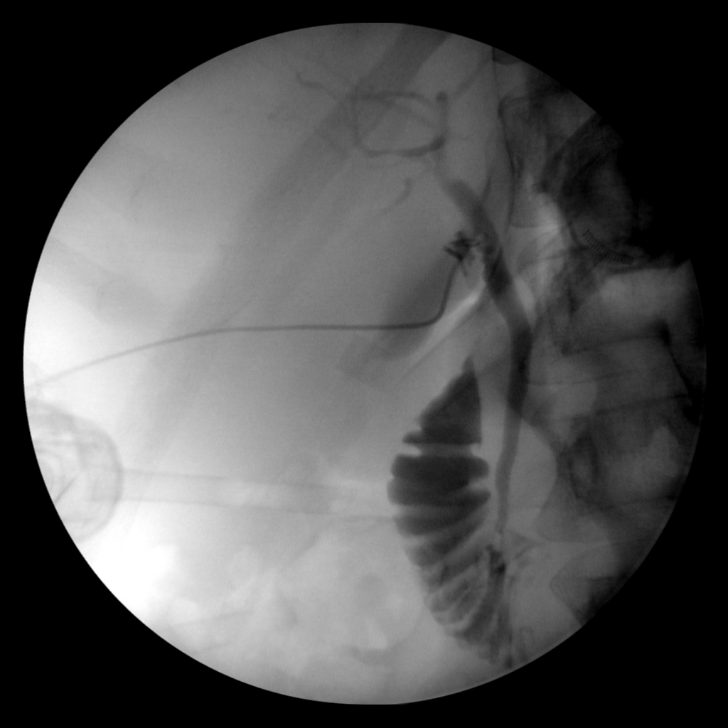
[im 2/2]
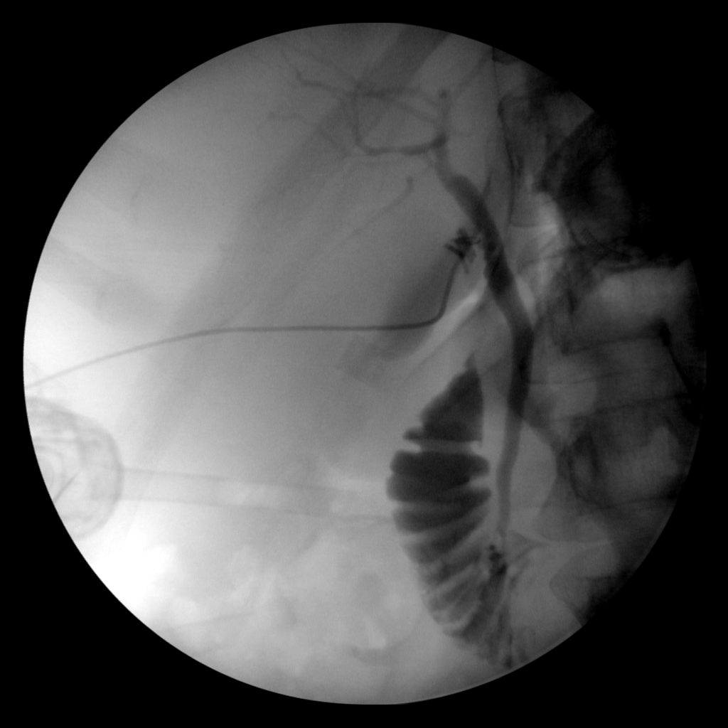
[im 2/2]
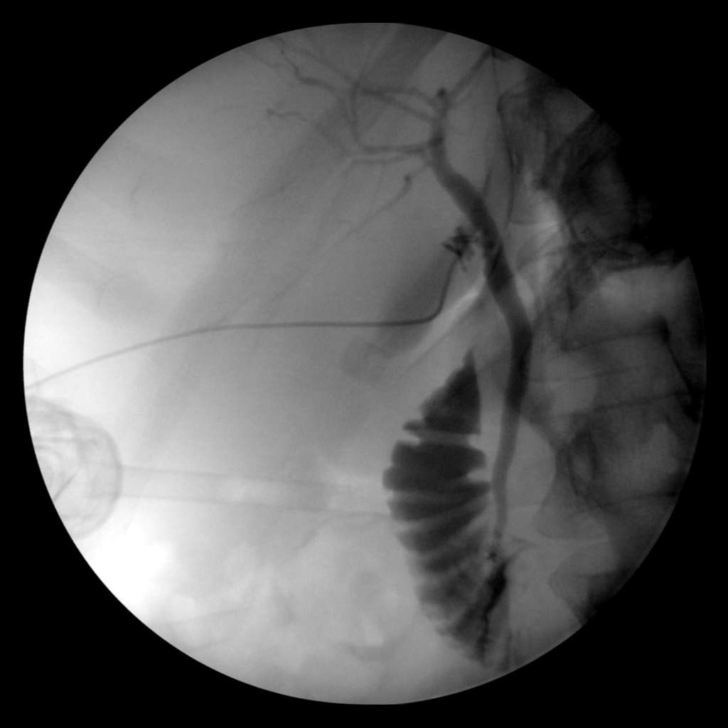
[im 2/2]
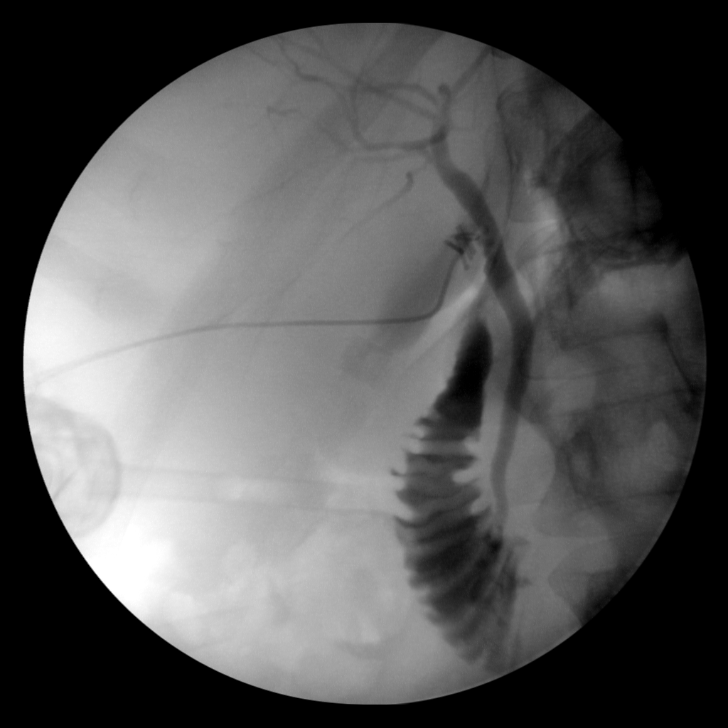

[8 of 8 positions shown; findings below may reference images not displayed]

FINDINGS: No persistent filling defects in the common duct. Intrahepatic ducts
are incompletely visualized, appearing decompressed centrally.
Contrast passes into the duodenum.

:
Negative for retained common duct stone.

## 2015-09-16 ENCOUNTER — Emergency Department (HOSPITAL_COMMUNITY)
Admission: EM | Admit: 2015-09-16 | Discharge: 2015-09-16 | Disposition: A | Discharge status: Home / Self Care | Payer: Managed Care, Other (non HMO) | Attending: Emergency Medicine | Admitting: Emergency Medicine

## 2015-09-16 ENCOUNTER — Emergency Department (HOSPITAL_COMMUNITY): Payer: Managed Care, Other (non HMO)

## 2015-09-16 ENCOUNTER — Encounter (HOSPITAL_COMMUNITY): Payer: Self-pay | Admitting: Emergency Medicine

## 2015-09-16 DIAGNOSIS — Z8659 Personal history of other mental and behavioral disorders: Secondary | ICD-10-CM | POA: Insufficient documentation

## 2015-09-16 DIAGNOSIS — W108XXA Fall (on) (from) other stairs and steps, initial encounter: Secondary | ICD-10-CM | POA: Insufficient documentation

## 2015-09-16 DIAGNOSIS — Z8669 Personal history of other diseases of the nervous system and sense organs: Secondary | ICD-10-CM | POA: Insufficient documentation

## 2015-09-16 DIAGNOSIS — Y9301 Activity, walking, marching and hiking: Secondary | ICD-10-CM | POA: Diagnosis not present

## 2015-09-16 DIAGNOSIS — Y9289 Other specified places as the place of occurrence of the external cause: Secondary | ICD-10-CM | POA: Diagnosis not present

## 2015-09-16 DIAGNOSIS — Z79899 Other long term (current) drug therapy: Secondary | ICD-10-CM | POA: Diagnosis not present

## 2015-09-16 DIAGNOSIS — Y998 Other external cause status: Secondary | ICD-10-CM | POA: Insufficient documentation

## 2015-09-16 DIAGNOSIS — S99922A Unspecified injury of left foot, initial encounter: Secondary | ICD-10-CM | POA: Insufficient documentation

## 2015-09-16 DIAGNOSIS — S0990XA Unspecified injury of head, initial encounter: Secondary | ICD-10-CM

## 2015-09-16 DIAGNOSIS — Z7951 Long term (current) use of inhaled steroids: Secondary | ICD-10-CM | POA: Insufficient documentation

## 2015-09-16 DIAGNOSIS — Z79818 Long term (current) use of other agents affecting estrogen receptors and estrogen levels: Secondary | ICD-10-CM | POA: Diagnosis not present

## 2015-09-16 NOTE — ED Notes (Signed)
Pt tripped and hit the left side of her head against her wall this morning around 04:30 and went back to sleep. While at work began seeing a black shape in the center of her vision with "colorful prisms surrounding it." States she saw the school nurse who reccommended she come here, states the episode of visual changes lasted around 30 minutes. No acute neuro symptoms observed at this time.

## 2015-09-16 NOTE — Discharge Instructions (Signed)
1. Medications: tylenol for headache, usual home medications 2. Treatment: rest, drink plenty of fluids 3. Follow Up: please followup with your primary doctor in 2-3 days for discussion of your diagnoses and further evaluation after today's visit; please return to the ER for severe headache, numbness, weakness, new or worsening symptoms   Concussion, Adult A concussion is a brain injury. It is caused by:  A hit to the head.  A quick and sudden movement (jolt) of the head or neck. A concussion is usually not life threatening. Even so, it can cause serious problems. If you had a concussion before, you may have concussion-like problems after a hit to your head. HOME CARE General Instructions  Follow your doctor's directions carefully.  Take medicines only as told by your doctor.  Only take medicines your doctor says are safe.  Do not drink alcohol until your doctor says it is okay. Alcohol and some drugs can slow down healing. They can also put you at risk for further injury.  If you are having trouble remembering things, write them down.  Try to do one thing at a time if you get distracted easily. For example, do not watch TV while making dinner.  Talk to your family members or close friends when making important decisions.  Follow up with your doctor as told.  Watch your symptoms. Tell others to do the same. Serious problems can sometimes happen after a concussion. Older adults are more likely to have these problems.  Tell your teachers, school nurse, school counselor, coach, Product/process development scientist, or work Freight forwarder about your concussion. Tell them about what you can or cannot do. They should watch to see if:  It gets even harder for you to pay attention or concentrate.  It gets even harder for you to remember things or learn new things.  You need more time than normal to finish things.  You become annoyed (irritable) more than before.  You are not able to deal with stress as  well.  You have more problems than before.  Rest. Make sure you:  Get plenty of sleep at night.  Go to sleep early.  Go to bed at the same time every day. Try to wake up at the same time.  Rest during the day.  Take naps when you feel tired.  Limit activities where you have to think a lot or concentrate. These include:  Doing homework.  Doing work related to a job.  Watching TV.  Using the computer. Returning To Your Regular Activities Return to your normal activities slowly, not all at once. You must give your body and brain enough time to heal.   Do not play sports or do other athletic activities until your doctor says it is okay.  Ask your doctor when you can drive, ride a bicycle, or work other vehicles or machines. Never do these things if you feel dizzy.  Ask your doctor about when you can return to work or school. Preventing Another Concussion It is very important to avoid another brain injury, especially before you have healed. In rare cases, another injury can lead to permanent brain damage, brain swelling, or death. The risk of this is greatest during the first 7-10 days after your injury. Avoid injuries by:   Wearing a seat belt when riding in a car.  Not drinking too much alcohol.  Avoiding activities that could lead to a second concussion (such as contact sports).  Wearing a helmet when doing activities like:  Biking.  Skiing.  Skateboarding.  Skating.  Making your home safer by:  Removing things from the floor or stairways that could make you trip.  Using grab bars in bathrooms and handrails by stairs.  Placing non-slip mats on floors and in bathtubs.  Improve lighting in dark areas. GET HELP IF:  It gets even harder for you to pay attention or concentrate.  It gets even harder for you to remember things or learn new things.  You need more time than normal to finish things.  You become annoyed (irritable) more than before.  You are  not able to deal with stress as well.  You have more problems than before.  You have problems keeping your balance.  You are not able to react quickly when you should. Get help if you have any of these problems for more than 2 weeks:   Lasting (chronic) headaches.  Dizziness or trouble balancing.  Feeling sick to your stomach (nausea).  Seeing (vision) problems.  Being affected by noises or light more than normal.  Feeling sad, low, down in the dumps, blue, gloomy, or empty (depressed).  Mood changes (mood swings).  Feeling of fear or nervousness about what may happen (anxiety).  Feeling annoyed.  Memory problems.  Problems concentrating or paying attention.  Sleep problems.  Feeling tired all the time. GET HELP RIGHT AWAY IF:   You have bad headaches or your headaches get worse.  You have weakness (even if it is in one hand, leg, or part of the face).  You have loss of feeling (numbness).  You feel off balance.  You keep throwing up (vomiting).  You feel tired.  One black center of your eye (pupil) is larger than the other.  You twitch or shake violently (convulse).  Your speech is not clear (slurred).  You are more confused, easily angered (agitated), or annoyed than before.  You have more trouble resting than before.  You are unable to recognize people or places.  You have neck pain.  It is difficult to wake you up.  You have unusual behavior changes.  You pass out (lose consciousness). MAKE SURE YOU:   Understand these instructions.  Will watch your condition.  Will get help right away if you are not doing well or get worse.   This information is not intended to replace advice given to you by your health care provider. Make sure you discuss any questions you have with your health care provider.   Document Released: 05/13/2009 Document Revised: 06/15/2014 Document Reviewed: 12/15/2012 Elsevier Interactive Patient Education [employer]

## 2015-09-16 NOTE — ED Provider Notes (Signed)
CSN: [id number]     Arrival date & time 09/16/15  1346 History   First MD Initiated Contact with Patient 09/16/15 1643     Chief Complaint  Patient presents with  . Blurred Vision  . Head Injury    HPI   Ariel Nicholson is a 55 y.o. female with a PMH of anxiety who presents to the ED with head injury. She states she was walking down her stairs this morning to let her cat out when she tripped and fell down 3 stairs. She notes she hit the left side of her head, though denies LOC. She reports she went back to sleep and went to work (patient is a Pharmacist, hospital). She states around 11 AM this morning, she started to have a black shape in her vision with colorful prisms surrounding it. She reports this lasted for approximately 30 minutes and resolved. She notes associated headache. She denies dizziness, lightheadedness, numbness, weakness, paresthesia, neck pain, back pain, chest pain, shortness of breath, abdominal pain, nausea, vomiting. She notes mild left great toe pain, and attributes this to her fall. Patient does not take anticoagulants.   Past Medical History  Diagnosis Date  . Anxiety   . 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 PONV (postoperative nausea and vomiting)    Past Surgical History  Procedure Laterality Date  . Removal of norplant  1997  . Cystectomy  1999    dermiod cyst  . 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 Tonsillectomy and adenoidectomy    . Cholecystectomy N/A 05/12/2013    Procedure: LAPAROSCOPIC CHOLECYSTECTOMY WITH INTRAOPERATIVE CHOLANGIOGRAM;  Surgeon: Earnstine Regal, MD;  Location: [facility];  Service: General;  Laterality: N/A;  . Stapedectomy Right 09/2014   Family History  Problem Relation Age of Onset  . Hypertension Mother   . Osteoporosis Mother   . Cancer Mother     pancreatic cancer  . Hypertension Father   . Heart disease Father   . Osteoporosis Maternal Grandmother   . Cancer Maternal Grandfather    colon   Social History  Substance Use Topics  . Smoking status: Never Smoker   . Smokeless tobacco: Never Used  . Alcohol Use: 3.5 oz/week    7 Standard drinks or equivalent per week     Comment: 5-6 glasses of wine a week   OB History    Gravida Para Term Preterm AB TAB SAB Ectopic Multiple Living   3 2 0 0 1 1 0 0 0 2       Review of Systems  Eyes: Positive for visual disturbance.  Respiratory: Negative for shortness of breath.   Cardiovascular: Negative for chest pain.  Gastrointestinal: Negative for nausea and abdominal pain.  Musculoskeletal: Positive for arthralgias. Negative for back pain and neck pain.  Neurological: Positive for headaches. Negative for dizziness, syncope, weakness, light-headedness and numbness.  All other systems reviewed and are negative.     Allergies  Bactrim and Cephalexin  Home Medications   Prior to Admission medications   Medication Sig Start Date End Date Taking? Authorizing Provider  Calcium-Phosphorus-Vitamin D (CITRACAL CALCIUM GUMMIES) [id number] MG-MG-UNIT CHEW Chew 1 capsule by mouth daily.   Yes Historical Provider, MD  estradiol (ESTRACE) 1 MG tablet Take 1 tablet (1 mg total) by mouth daily. 03/11/15  Yes Kem Boroughs, FNP  fluticasone (FLONASE) 50 MCG/ACT nasal spray Place 2 sprays into the nose daily  as needed for allergies.    Yes Historical Provider, MD  irbesartan (AVAPRO) 150 MG tablet Take 1 tablet by mouth at bedtime.  01/02/14  Yes Historical Provider, MD  medroxyPROGESTERone (PROVERA) 10 MG tablet take 1 tablet by mouth once daily (DAYS 1 THRU 10 OF EACH MONTH) 03/11/15  Yes Kem Boroughs, FNP  Multiple Vitamin (MULTIVITAMIN) capsule Take 1 capsule by mouth daily.   Yes Historical Provider, MD  Sod Monofluorphosphate-Ca Carb (MONOCAL PO) Take 1 tablet by mouth daily.   Yes Historical Provider, MD  Wheat Dextrin (BENEFIBER DRINK MIX PO) Take 1 scoop by mouth daily.    Yes Historical Provider, MD    BP 123/87 mmHg   Pulse 77  Temp(Src) 98.3 F (36.8 C) (Oral)  Resp 14  SpO2 100%  LMP 09/16/2013 (Exact Date) Physical Exam  Constitutional: She is oriented to person, place, and time. She appears well-developed and well-nourished. No distress.  HENT:  Head: Normocephalic and atraumatic. Head is without raccoon's eyes, without Battle's sign, without abrasion, without contusion and without laceration.  Right Ear: Hearing, tympanic membrane, external ear and ear canal normal. No drainage. No hemotympanum.  Left Ear: Hearing, tympanic membrane, external ear and ear canal normal. No drainage. No hemotympanum.  Nose: Nose normal. Right sinus exhibits no maxillary sinus tenderness and no frontal sinus tenderness. Left sinus exhibits no maxillary sinus tenderness and no frontal sinus tenderness.  Mouth/Throat: Uvula is midline, oropharynx is clear and moist and mucous membranes are normal.  Eyes: Conjunctivae, EOM and lids are normal. Pupils are equal, round, and reactive to light. Right eye exhibits no discharge. Left eye exhibits no discharge. No scleral icterus.  Neck: Normal range of motion. Neck supple.  Cardiovascular: Normal rate, regular rhythm, normal heart sounds, intact distal pulses and normal pulses.   Pulmonary/Chest: Effort normal and breath sounds normal. No respiratory distress. She has no wheezes. She has no rales. She exhibits no tenderness.  Abdominal: Soft. Normal appearance and bowel sounds are normal. She exhibits no distension and no mass. There is no tenderness. There is no rigidity, no rebound and no guarding.  Musculoskeletal: Normal range of motion. She exhibits no edema or tenderness.  No TTP to upper extremities bilaterally or pelvis. Mild TTP to left great toe over MCP, otherwise, no TTP to lower extremities bilaterally. No TTP to cervical, thoracic, or lumbar spine.   Neurological: She is alert and oriented to person, place, and time. She has normal strength. No cranial nerve deficit or  sensory deficit. She displays a negative Romberg sign. Coordination and gait normal. GCS eye subscore is 4. GCS verbal subscore is 5. GCS motor subscore is 6.  Skin: Skin is warm, dry and intact. No rash noted. She is not diaphoretic. No erythema. No pallor.  Psychiatric: She has a normal mood and affect. Her speech is normal and behavior is normal.  Nursing note and vitals reviewed.   ED Course  Procedures (including critical care time)  Labs Review Labs Reviewed - No data to display  Imaging Review Ct Head Wo Contrast  09/16/2015  CLINICAL DATA:  [place] and hit head.  Blurred vision. EXAM: CT HEAD WITHOUT CONTRAST TECHNIQUE: Contiguous axial images were obtained from the base of the skull through the vertex without intravenous contrast. COMPARISON:  None. FINDINGS: Ventricle size is normal. Negative for acute or chronic infarction. Negative for hemorrhage or fluid collection. Negative for mass or edema. No shift of the midline structures. Calvarium is intact. IMPRESSION: Negative Electronically Signed  By: Franchot Gallo M.D.   On: 09/16/2015 14:29   I have personally reviewed and evaluated these images as part of my medical decision-making.   EKG Interpretation None      MDM   Final diagnoses:  Head injury, initial encounter    55 year old female presents after falling down 3 steps this morning and hitting her head. Notes changes in her vision at work, now resolved, and mild headache. Reports left great toe pain. Denies LOC, dizziness, lightheadedness, numbness, weakness, paresthesia, neck pain, back pain, chest pain, shortness of breath, abdominal pain, nausea, vomiting. No anticoagulant use.  Patient is afebrile. Vital signs stable. Head normocephalic and atraumatic. GCS 15. Normal neuro exam with no focal deficit. Patient ambulates without difficulty. Heart RRR. Lungs clear to auscultation bilaterally. Abdomen soft, non-tender, non-distended. No TTP to upper extremities  bilaterally or pelvis. Mild TTP to left great toe over MCP, otherwise, no TTP to lower extremities bilaterally. No TTP to cervical, thoracic, or lumbar spine.    Head CT ordered in triage, negative for acute abnormality. Patient declines imaging to evaluate left great toe pain and declines medication for headache.  Patient is non-toxic and well-appearing, feel she is stable for discharge at this time. Patient to follow-up with PCP in 2-3 days. Strict return precautions discussed. Patient verbalizes her understanding and is in agreement with plan.  BP 123/87 mmHg  Pulse 77  Temp(Src) 98.3 F (36.8 C) (Oral)  Resp 14  SpO2 100%  LMP 09/16/2013 (Exact Date)     Marella Chimes, PA-C 09/17/15 0121  Sharlett Iles, MD 09/18/15 1505

## 2016-01-09 ENCOUNTER — Encounter: Payer: Self-pay | Admitting: Nurse Practitioner

## 2016-01-16 ENCOUNTER — Telehealth: Payer: Self-pay | Admitting: Nurse Practitioner

## 2016-01-16 NOTE — Telephone Encounter (Signed)
Patient wanting to speak with nurse about getting a prescription for citalopram sent to [facility] at [phone].

## 2016-01-16 NOTE — Telephone Encounter (Signed)
Spoke with patient. Patient states that Kem Boroughs, FNP has previously written her an rx for Citalopram which she reports she was on for years. States that she weaned herself off and now feels she needs to go back on this medication. 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 Reports not feeling herself. Denies any episodes of major depression, or SI/HI. She was previously taking Citalopram 20 mg daily. Advised I will speak with Kem Boroughs, FNP regarding refill and return call with further recommendations. She is agreeable. Pharmacy on file is correct.

## 2016-01-17 MED ORDER — CITALOPRAM HYDROBROMIDE 20 MG PO TABS: 20.0000 mg | ORAL_TABLET | Freq: Every day | ORAL | 1 refills | Status: DC

## 2016-01-17 NOTE — Telephone Encounter (Signed)
Message left on voicemail (name verified) that RX has been sent to pharmacy.  To call with any questions.

## 2016-03-13 ENCOUNTER — Ambulatory Visit (INDEPENDENT_AMBULATORY_CARE_PROVIDER_SITE_OTHER): Payer: Managed Care, Other (non HMO) | Admitting: Nurse Practitioner

## 2016-03-13 ENCOUNTER — Encounter: Payer: Self-pay | Admitting: Nurse Practitioner

## 2016-03-13 VITALS — BP 128/80 | HR 76 | Ht 66.75 in | Wt 152.0 lb

## 2016-03-13 DIAGNOSIS — Z0000 Encounter for general adult medical examination without abnormal findings: Secondary | ICD-10-CM | POA: Diagnosis not present

## 2016-03-13 DIAGNOSIS — Z01419 Encounter for gynecological examination (general) (routine) without abnormal findings: Secondary | ICD-10-CM | POA: Diagnosis not present

## 2016-03-13 DIAGNOSIS — Z79890 Hormone replacement therapy (postmenopausal): Secondary | ICD-10-CM | POA: Diagnosis not present

## 2016-03-13 DIAGNOSIS — N951 Menopausal and female climacteric states: Secondary | ICD-10-CM | POA: Diagnosis not present

## 2016-03-13 MED ORDER — ESTRADIOL-NORETHINDRONE ACET 0.05-0.14 MG/DAY TD PTTW: 1.0000 | MEDICATED_PATCH | TRANSDERMAL | 0 refills | Status: DC

## 2016-03-13 NOTE — Patient Instructions (Addendum)

## 2016-03-13 NOTE — Progress Notes (Signed)
Patient ID: Ariel Nicholson, female   DOB: Mar 13, 1961, 55 y.o.   MRN: [id number]  55 y.o. [id number] Married  Caucasian Fe here for annual exam.  She had a head injury 09/2015 with mild concussion.  Wants to try Combipatch and will be checking with insurance for coverage.  If not covered will do Estradiol 0.5 mg and Provera 5 mg daily.  She will be calling back.  Patient's last menstrual period was 09/16/2013 (exact date).          Sexually active: Yes.    The current method of family planning is post menopausal status.    Exercising: Yes.    walking Smoker:  no  Health Maintenance: Pap: 01/06/2013, Negative with Neg HR HPV MMG:01/02/16, 3D, Bi-Rads 1: Negative Colonoscopy:04/27/14, Normal, repeat in 10 years BMD: Never, schedule/discuss today TDaP:12/30/14 Hep C and HIV: blood donation 3 weeks ago Labs: Dr. Brigitte Pulse takes care of all labs   reports that she has never smoked. She has never used smokeless tobacco. She reports that she drinks about 3.5 oz of alcohol per week . She reports that she does not use drugs.  Past Medical History:  Diagnosis Date  . Anxiety   . 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 PONV (postoperative nausea and vomiting)     Past Surgical History:  Procedure Laterality Date  . CHOLECYSTECTOMY N/A 05/12/2013   Procedure: LAPAROSCOPIC CHOLECYSTECTOMY WITH INTRAOPERATIVE CHOLANGIOGRAM;  Surgeon: Earnstine Regal, MD;  Location: [facility];  Service: General;  Laterality: N/A;  . CYSTECTOMY  1999   dermiod cyst  . removal of norplant  1997  . STAPEDECTOMY Right 09/2014  . TONSILLECTOMY AND ADENOIDECTOMY    . 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    Current Outpatient Prescriptions  Medication Sig Dispense Refill  . Cholecalciferol (VITAMIN D) 2000 units CAPS Take 1 capsule by mouth daily.    . citalopram (CELEXA) 20 MG tablet Take 1 tablet (20 mg total) by mouth daily. 90 tablet 1  . estradiol (ESTRACE) 1 MG tablet Take 1  tablet (1 mg total) by mouth daily. 90 tablet 3  . fluticasone (FLONASE) 50 MCG/ACT nasal spray Place 2 sprays into the nose daily as needed for allergies.     Marland Kitchen irbesartan (AVAPRO) 150 MG tablet Take 1 tablet by mouth at bedtime.     . medroxyPROGESTERone (PROVERA) 10 MG tablet take 1 tablet by mouth once daily (DAYS 1 THRU 10 OF EACH MONTH) 30 tablet 3  . Multiple Vitamin (MULTIVITAMIN) capsule Take 1 capsule by mouth daily.    . Sod Monofluorphosphate-Ca Carb (MONOCAL PO) Take 1 tablet by mouth daily. Monocal    . Wheat Dextrin (BENEFIBER DRINK MIX PO) Take 1 scoop by mouth daily.     Derrill Memo ON 03/16/2016] estradiol-norethindrone (COMBIPATCH) 0.05-0.14 MG/DAY Place 1 patch onto the skin 2 (two) times a week. 12 patch 0   No current facility-administered medications for this visit.     Family History  Problem Relation Age of Onset  . Hypertension Mother   . Osteoporosis Mother     after age 40  . Cancer Mother     pancreatic cancer  . Hypertension Father   . Heart disease Father   . Heart attack Father   . Osteoporosis Maternal Grandmother   . Cancer Maternal Grandfather     colon    ROS:  Pertinent items are noted in HPI.  Otherwise, a comprehensive ROS was negative.  Exam:   BP 128/80 (BP Location: Right Arm, Patient Position: Sitting, Cuff Size: Normal)   Pulse 76   Ht 5' 6.75" (1.695 m)   Wt 152 lb (68.9 kg)   LMP 09/16/2013 (Exact Date)   BMI 23.99 kg/m  Height: 5' 6.75" (169.5 cm) Ht Readings from Last 3 Encounters:  03/13/16 5' 6.75" (1.695 m)  03/11/15 5' 7.5" (1.715 m)  01/09/14 5' 7.5" (1.715 m)    General appearance: alert, cooperative and appears stated age Head: Normocephalic, without obvious abnormality, atraumatic Neck: no adenopathy, supple, symmetrical, trachea midline and thyroid normal to inspection and palpation Lungs: clear to auscultation bilaterally Breasts: normal appearance, no masses or tenderness Heart: regular rate and rhythm Abdomen:  soft, non-tender; no masses,  no organomegaly Extremities: extremities normal, atraumatic, no cyanosis or edema Skin: Skin color, texture, turgor normal. No rashes or lesions Lymph nodes: Cervical, supraclavicular, and axillary nodes normal. No abnormal inguinal nodes palpated Neurologic: Grossly normal   Pelvic: External genitalia:  no lesions              Urethra:  normal appearing urethra with no masses, tenderness or lesions              Bartholin's and Skene's: normal                 Vagina: normal appearing vagina with normal color and discharge, no lesions              Cervix: anteverted              Pap taken: Yes.   Bimanual Exam:  Uterus:  normal size, contour, position, consistency, mobility, non-tender              Adnexa: no mass, fullness, tenderness               Rectovaginal: Confirms               Anus:  normal sphincter tone, no lesions  Chaperone present: yes  A:  Well Woman with normal exam  Postmenopausal on HRT since 06/2013             S/P Stapedectomy 09/2014 for Otosclerosis - with now no use of hearing aid after 6 years  [place]:  Osteopenia after age 60    P:   Reviewed health and wellness pertinent to exam  Pap smear was done  Mammogram is due 12/2016  Will try and get Combipatch 0.05/0.14 - written RX is given  If not covered will order Estradiol 0.5 mg and Provera 5 mg daily - she may try and taper for this next month by alternating the 1 mg and 0.5 mg.  If she does will leave Provera at 10 mg day 1-10 for November and go with the decreased dose of Estradiol and Provera together in December.  Counseled with risk of CVA, DVT, cancer  Counseled on breast self exam, mammography screening, use and side effects of HRT, adequate intake of calcium and vitamin D, diet and exercise return annually or prn  An After Visit Summary was printed and given to the patient.

## 2016-03-17 LAB — IPS PAP TEST WITH HPV

## 2016-03-17 NOTE — Progress Notes (Signed)
Encounter reviewed by Dr. Brook Amundson C. Silva.  

## 2016-04-08 ENCOUNTER — Other Ambulatory Visit: Payer: Self-pay | Admitting: Nurse Practitioner

## 2016-04-09 NOTE — Telephone Encounter (Signed)
Medication refill request: Estradiol 1mg  Last AEX:  03/13/16 PG Next AEX: 03/16/17 Last MMG (if hormonal medication request): 01/02/16 3D, BIRADS 1 negative Refill authorized: 03/11/15 #90 w/3 refills; today please advise   Medication refill request: PROVERA 10mg  Last AEX:  03/13/16 PG Next AEX: 03/16/17 Last MMG (if hormonal medication request): 01/02/16 3D, BIRADS1 negative Refill authorized: 03/11/15 #30 w/3 refills; today please advise

## 2016-04-09 NOTE — Telephone Encounter (Signed)
Per Patty note she was to check with insurance regarding combi patch, did she fill that Rx, if not going to use will refill RX  She will need a call

## 2016-04-10 NOTE — Telephone Encounter (Signed)
Tried calling patient, no answer, left message to call back

## 2016-04-10 NOTE — Telephone Encounter (Signed)
Spoke with patient, she states that Combipatch is Tier 3 and will cost about $60 for 30-day supply, however she would like to try this first and if it becomes too expensive, will switch back to Estradiol.

## 2016-04-14 NOTE — Telephone Encounter (Signed)
She was given a written RX per Patty note for combi patch

## 2016-06-23 ENCOUNTER — Telehealth: Payer: Self-pay | Admitting: Nurse Practitioner

## 2016-06-23 NOTE — Telephone Encounter (Signed)
Spoke with patient. Patient was last seen on 03/13/2016 for aex with Kem Boroughs, FNP. Patient is currently on [place] .05/0.14. Patient would like to switch back to taking oral Estradiol and Provera. States she has had headaches with the patch and it irritates her skin. Per review of note from aex it was recommended that the patient alternate Estradiol 1 mg and 0.5 mg.  If she does will leave Provera at 10 mg day 1-10 for one month and go with the decreased dose of Estradiol and Provera together the next. Advised I will review with Kem Boroughs, FNP and return call with recommendations. Patient is agreeable.  Kem Boroughs, FNP please advise.

## 2016-06-23 NOTE — Telephone Encounter (Signed)
Patient requesting her medication be changed back to what she was taking.  No other info given

## 2016-06-26 MED ORDER — ESTRADIOL 1 MG PO TABS: 1.0000 mg | ORAL_TABLET | Freq: Every day | ORAL | 2 refills | Status: DC

## 2016-06-26 MED ORDER — MEDROXYPROGESTERONE ACETATE 10 MG PO TABS: ORAL_TABLET | ORAL | 2 refills | Status: DC

## 2016-06-26 NOTE — Telephone Encounter (Signed)
Yes she may switch back to oral routine of HRT.  Most likely insurance is not covering the patch.

## 2016-06-26 NOTE — Telephone Encounter (Signed)
Reviewed medication dosing and quantity with Kem Boroughs, FNP. Rx for Estradiol 1 mg take 1 tablet daily #90 2RF (patient may switch between 1 mg daily and .5 mg daily for 1 month then decrease to .5 mg daily). Rx for Provera 10 mg days 1-10 each month #30 2RF sent to pharmacy on file. Per Kem Boroughs, FNP if patient switches to Estradiol 0.5 mg daily and is doing well will need to call back to lower Provera dosage. Patient is agreeable and will contact the office towards the end of month 2 when she is taking Estradiol 0.5 mg daily to provide update on how she is doing to see if Provera needs to be adjusted.  Kem Boroughs, FNP do you agree with recommendations?

## 2016-06-29 NOTE — Telephone Encounter (Signed)
Agree with plan. OK to close.

## 2016-07-11 ENCOUNTER — Other Ambulatory Visit: Payer: Self-pay | Admitting: Nurse Practitioner

## 2016-07-13 NOTE — Telephone Encounter (Signed)
Medication refill request: Citalopram 20mg  Last AEX:  03/13/16 PG Next AEX: 03/16/17 Last MMG (if hormonal medication request): 01/02/16 BIRADS 1 negative Refill authorized: 01/17/16 #90 w/1 refills; today please advise

## 2016-12-23 ENCOUNTER — Telehealth: Payer: Self-pay | Admitting: Obstetrics and Gynecology

## 2016-12-23 NOTE — Telephone Encounter (Signed)
Spoke with patient and cancelled her [name] appointment.  Patient will call back to reschedule

## 2017-01-08 ENCOUNTER — Other Ambulatory Visit: Payer: Self-pay | Admitting: *Deleted

## 2017-01-08 MED ORDER — MEDROXYPROGESTERONE ACETATE 10 MG PO TABS: ORAL_TABLET | ORAL | 2 refills | Status: DC

## 2017-01-08 NOTE — Telephone Encounter (Signed)
Faxed refill request received from [facility] for MEDROXYPROGESTERONE 90-day supply Last filled by MD on 06/26/16, #30 X 2 RF to [facility] (takes daily days 1-10) Last AEX - 03/13/16 PG Next AEX - not scheduled, pt will call to reschedule with new provider. Last MMG - 01/06/17 per Anderson Malta at [place], report not available  Please advise refill. Thank you.

## 2017-03-15 ENCOUNTER — Ambulatory Visit: Payer: Managed Care, Other (non HMO) | Admitting: Nurse Practitioner

## 2017-03-16 ENCOUNTER — Ambulatory Visit: Payer: Managed Care, Other (non HMO) | Admitting: Nurse Practitioner

## 2017-03-18 NOTE — Progress Notes (Deleted)
56 y.o. [id number] Married Caucasian female here for annual exam.    PCP:     Patient's last menstrual period was 09/16/2013 (exact date).           Sexually active: {yes no:314532}  The current method of family planning is post menopausal status.    Exercising: {yes no:314532}  {types:19826} Smoker:  no  Health Maintenance: Pap: 03-13-16 Neg:Neg HR HPV,  01-06-13 Neg:Neg HR HPV History of abnormal Pap:  {YES NO:22349} MMG: 01-06-17 3D Density B/Neg/BiRads1:[facility] Colonoscopy: 04-27-14 normal with Dr.Mann;next 04/2024. BMD:   ***  Result  *** TDaP:  12-30-14 Gardasil:   no HIV:*** Hep C:*** Screening Labs:  Hb today: ***, Urine today: ***   reports that she has never smoked. She has never used smokeless tobacco. She reports that she drinks about 3.5 oz of alcohol per week . She reports that she does not use drugs.  Past Medical History:  Diagnosis Date  . Anxiety   . 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 PONV (postoperative nausea and vomiting)     Past Surgical History:  Procedure Laterality Date  . CHOLECYSTECTOMY N/A 05/12/2013   Procedure: LAPAROSCOPIC CHOLECYSTECTOMY WITH INTRAOPERATIVE CHOLANGIOGRAM;  Surgeon: Earnstine Regal, MD;  Location: [facility];  Service: General;  Laterality: N/A;  . CYSTECTOMY  1999   dermiod cyst  . removal of norplant  1997  . STAPEDECTOMY Right 09/2014  . TONSILLECTOMY AND ADENOIDECTOMY    . 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    Current Outpatient Prescriptions  Medication Sig Dispense Refill  . Cholecalciferol (VITAMIN D) 2000 units CAPS Take 1 capsule by mouth daily.    . citalopram (CELEXA) 20 MG tablet take 1 tablet by mouth once daily 90 tablet 3  . estradiol (ESTRACE) 1 MG tablet Take 1 tablet (1 mg total) by mouth daily. 90 tablet 2  . fluticasone (FLONASE) 50 MCG/ACT nasal spray Place 2 sprays into the nose daily as needed for allergies.     Marland Kitchen irbesartan (AVAPRO) 150 MG tablet Take 1 tablet by  mouth at bedtime.     . medroxyPROGESTERone (PROVERA) 10 MG tablet take 1 tablet by mouth once daily (DAYS 1 THRU 10 OF EACH MONTH) 30 tablet 2  . Multiple Vitamin (MULTIVITAMIN) capsule Take 1 capsule by mouth daily.    . Sod Monofluorphosphate-Ca Carb (MONOCAL PO) Take 1 tablet by mouth daily. Monocal    . Wheat Dextrin (BENEFIBER DRINK MIX PO) Take 1 scoop by mouth daily.      No current facility-administered medications for this visit.     Family History  Problem Relation Age of Onset  . Hypertension Mother   . Osteoporosis Mother        after age 21  . Cancer Mother        pancreatic cancer  . Hypertension Father   . Heart disease Father   . Heart attack Father   . Osteoporosis Maternal Grandmother   . Cancer Maternal Grandfather        colon    ROS:  Pertinent items are noted in HPI.  Otherwise, a comprehensive ROS was negative.  Exam:   LMP 09/16/2013 (Exact Date)     General appearance: alert, cooperative and appears stated age Head: Normocephalic, without obvious abnormality, atraumatic Neck: no adenopathy, supple, symmetrical, trachea midline and thyroid normal to inspection and palpation Lungs: clear to auscultation bilaterally Breasts: normal appearance,  no masses or tenderness, No nipple retraction or dimpling, No nipple discharge or bleeding, No axillary or supraclavicular adenopathy Heart: regular rate and rhythm Abdomen: soft, non-tender; no masses, no organomegaly Extremities: extremities normal, atraumatic, no cyanosis or edema Skin: Skin color, texture, turgor normal. No rashes or lesions Lymph nodes: Cervical, supraclavicular, and axillary nodes normal. No abnormal inguinal nodes palpated Neurologic: Grossly normal  Pelvic: External genitalia:  no lesions              Urethra:  normal appearing urethra with no masses, tenderness or lesions              Bartholins and Skenes: normal                 Vagina: normal appearing vagina with normal color and  discharge, no lesions              Cervix: no lesions              Pap taken: {yes no:314532} Bimanual Exam:  Uterus:  normal size, contour, position, consistency, mobility, non-tender              Adnexa: no mass, fullness, tenderness              Rectal exam: {yes no:314532}.  Confirms.              Anus:  normal sphincter tone, no lesions  Chaperone was present for exam.  Assessment:   Well woman visit with normal exam.   Plan: Mammogram screening discussed. Recommended self breast awareness. Pap and HR HPV as above. Guidelines for Calcium, Vitamin D, regular exercise program including cardiovascular and weight bearing exercise.   Follow up annually and prn.   Additional counseling given.  {yes [id number]. _______ minutes face to face time of which over 50% was spent in counseling.    After visit summary provided.

## 2017-03-19 ENCOUNTER — Ambulatory Visit: Payer: Managed Care, Other (non HMO) | Admitting: Obstetrics and Gynecology

## 2017-03-22 ENCOUNTER — Other Ambulatory Visit: Payer: Self-pay | Admitting: *Deleted

## 2017-03-22 MED ORDER — CITALOPRAM HYDROBROMIDE 20 MG PO TABS: 20.0000 mg | ORAL_TABLET | Freq: Every day | ORAL | 0 refills | Status: DC

## 2017-03-22 NOTE — Telephone Encounter (Signed)
Medication refill request: Celexa  Last AEX:  03-13-16  Next AEX: 04-28-17  Last MMG (if hormonal medication request): 01-06-17 WNL  Refill authorized: please advise

## 2017-03-25 NOTE — Telephone Encounter (Addendum)
Fax request coming from [facility] on [address] was sent to [employer] [facility]. Called [facility] and canceled Rx from 10/15.  I was going to send approved refill to [facility] but patient picked #[id number] on 10/15 from [facility] and still has 1 refill left from previous prescription. No Rx sent to [facility].   Has AEX coming up on 04/28/17. Has enough refills until then.  Routed to Dr. Quincy Simmonds for review.  Encounter closed.

## 2017-04-26 NOTE — Progress Notes (Signed)
56 y.o. [id number] Married Caucasian female here for annual exam.    Taking HRT cyclically for 3 years.  Sometimes forgets to do an on time start with Provera at the beginning of the month. No bleeding.  Was not feeling well and had anxiety when she first started on these. Also had joint pain.   Takes Citalopram for depression. Did not do well weaning off.  2 daughters.  One back at home with stressful issues.  Labs with PCP.   PCP:   Dr. Marton Redwood  Patient's last menstrual period was 09/16/2013 (exact date).           Sexually active: Yes.    The current method of family planning is post menopausal status.    Exercising: Yes.    walking Smoker:  no  Health Maintenance: Pap: 03-13-16 Neg:Neg HR HPV, 01-06-13 Neg:Neg HR HPV History of abnormal Pap:  no MMG: 01-06-17 3D Density B/Neg/BiRads1:[facility] Colonoscopy: 04/27/14 normal with Dr.Mann;next in 10 years. BMD:   On the cusp of osteopenia - BMD through PCP. TDaP:  12-30-14 Gardasil:   no HIV: donated blood 2017 Hep C: donated blod 2017 Screening Labs:  Hb today: PCP, Urine today: none collected    reports that  has never smoked. she has never used smokeless tobacco. She reports that she drinks about 3.5 oz of alcohol per week. She reports that she does not use drugs.  Past Medical History:  Diagnosis Date  . Anxiety   . 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 PONV (postoperative nausea and vomiting)     Past Surgical History:  Procedure Laterality Date  . CHOLECYSTECTOMY N/A 05/12/2013   Procedure: LAPAROSCOPIC CHOLECYSTECTOMY WITH INTRAOPERATIVE CHOLANGIOGRAM;  Surgeon: Earnstine Regal, MD;  Location: [facility];  Service: General;  Laterality: N/A;  . CYSTECTOMY  1999   dermiod cyst  . removal of norplant  1997  . STAPEDECTOMY Right 09/2014  . TONSILLECTOMY AND ADENOIDECTOMY    . 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    Current Outpatient Medications  Medication Sig Dispense Refill   . Cholecalciferol (VITAMIN D) 2000 units CAPS Take 1 capsule by mouth daily.    . citalopram (CELEXA) 20 MG tablet Take 1 tablet (20 mg total) by mouth daily. 90 tablet 0  . estradiol (ESTRACE) 1 MG tablet Take 1 tablet (1 mg total) by mouth daily. (Patient taking differently: Take 0.5 mg by mouth daily. ) 90 tablet 2  . fluticasone (FLONASE) 50 MCG/ACT nasal spray Place 2 sprays into the nose daily as needed for allergies.     Marland Kitchen irbesartan (AVAPRO) 150 MG tablet Take 1 tablet by mouth at bedtime.     . medroxyPROGESTERone (PROVERA) 10 MG tablet take 1 tablet by mouth once daily (DAYS 1 THRU 10 OF EACH MONTH) 30 tablet 2  . Multiple Vitamin (MULTIVITAMIN) capsule Take 1 capsule by mouth daily.    . Sod Monofluorphosphate-Ca Carb (MONOCAL PO) Take 1 tablet by mouth daily. Monocal    . Wheat Dextrin (BENEFIBER DRINK MIX PO) Take 1 scoop by mouth daily.      No current facility-administered medications for this visit.     Family History  Problem Relation Age of Onset  . Hypertension Mother   . Osteoporosis Mother        after age 40  . Cancer Mother        pancreatic cancer  . Hypertension  Father   . Heart disease Father   . Heart attack Father   . Osteoporosis Maternal Grandmother   . Cancer Maternal Grandfather        colon    ROS:  Pertinent items are noted in HPI.  Otherwise, a comprehensive ROS was negative.  Exam:   BP 118/74 (BP Location: Right Arm, Patient Position: Sitting, Cuff Size: Normal)   Pulse 80   Resp 14   Ht 5\' 7"  (1.702 m)   Wt 157 lb 6.4 oz (71.4 kg)   LMP 09/16/2013 (Exact Date)   BMI 24.65 kg/m     General appearance: alert, cooperative and appears stated age Head: Normocephalic, without obvious abnormality, atraumatic Neck: no adenopathy, supple, symmetrical, trachea midline and thyroid normal to inspection and palpation Lungs: clear to auscultation bilaterally Breasts: normal appearance, no masses or tenderness, No nipple retraction or dimpling, No  nipple discharge or bleeding, No axillary or supraclavicular adenopathy Heart: regular rate and rhythm Abdomen: soft, non-tender; no masses, no organomegaly Extremities: extremities normal, atraumatic, no cyanosis or edema Skin: Skin color, texture, turgor normal. No rashes or lesions Lymph nodes: Cervical, supraclavicular, and axillary nodes normal. No abnormal inguinal nodes palpated Neurologic: Grossly normal  Pelvic: External genitalia:  no lesions              Urethra:  normal appearing urethra with no masses, tenderness or lesions              Bartholins and Skenes: normal                 Vagina: normal appearing vagina with normal color and discharge, no lesions              Cervix: no lesions              Pap taken: No. Bimanual Exam:  Uterus:  normal size, contour, position, consistency, mobility, non-tender              Adnexa: no mass, fullness, tenderness              Rectal exam: Yes.  .  Confirms.              Anus:  normal sphincter tone, no lesions  Chaperone was present for exam.  Assessment:   Well woman visit with normal exam. HRT patient.  Situational stress.   Plan: Mammogram screening discussed. Recommended self breast awareness. Pap and HR HPV as above. Guidelines for Calcium, Vitamin D, regular exercise program including cardiovascular and weight bearing exercise. Refill of HRT.  Will continue Estrace 0.5 mg daily and switch to Provera 2.5 mg daily.  We discussed the WHI and risks of stroke, DVT, PE, MI, and breast cancer.  Continue Citalopram.  We discussed bone health and osteopenia/osteoporosis.  Follow up annually and prn.   After visit summary provided.

## 2017-04-28 ENCOUNTER — Ambulatory Visit (INDEPENDENT_AMBULATORY_CARE_PROVIDER_SITE_OTHER): Payer: Managed Care, Other (non HMO) | Admitting: Obstetrics and Gynecology

## 2017-04-28 ENCOUNTER — Other Ambulatory Visit: Payer: Self-pay

## 2017-04-28 ENCOUNTER — Encounter: Payer: Self-pay | Admitting: Obstetrics and Gynecology

## 2017-04-28 VITALS — BP 118/74 | HR 80 | Resp 14 | Ht 67.0 in | Wt 157.4 lb

## 2017-04-28 DIAGNOSIS — Z01419 Encounter for gynecological examination (general) (routine) without abnormal findings: Secondary | ICD-10-CM | POA: Diagnosis not present

## 2017-04-28 MED ORDER — ESTRADIOL 0.5 MG PO TABS: 0.5000 mg | ORAL_TABLET | Freq: Every day | ORAL | 3 refills | Status: DC

## 2017-04-28 MED ORDER — CITALOPRAM HYDROBROMIDE 20 MG PO TABS: 20.0000 mg | ORAL_TABLET | Freq: Every day | ORAL | 3 refills | Status: DC

## 2017-04-28 MED ORDER — MEDROXYPROGESTERONE ACETATE 2.5 MG PO TABS: ORAL_TABLET | ORAL | 3 refills | Status: DC

## 2017-04-28 NOTE — Patient Instructions (Signed)

## 2017-11-30 IMAGING — CT CT HEAD W/O CM
2 series · 17 of 30 positions shown, 20 images · non-contrast
Comparison: None.

CLINICAL DATA: Fell and hit head.  Blurred vision.

EXAM:
CT HEAD WITHOUT CONTRAST
TECHNIQUE: Contiguous axial images were obtained from the base of the skull
through the vertex without intravenous contrast.

[Series 2: head w/o · axial · non-contrast · 0.44mm/px · z∈[+197,+312]mm · 9 of 29 slices shown, 12 images]
[im 3/29  brain]
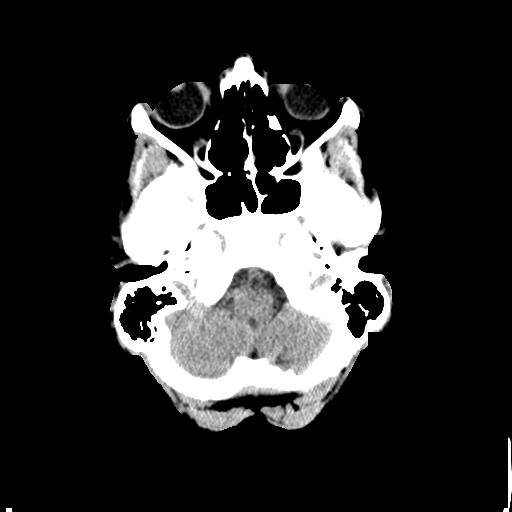
[im 3/29  bone]
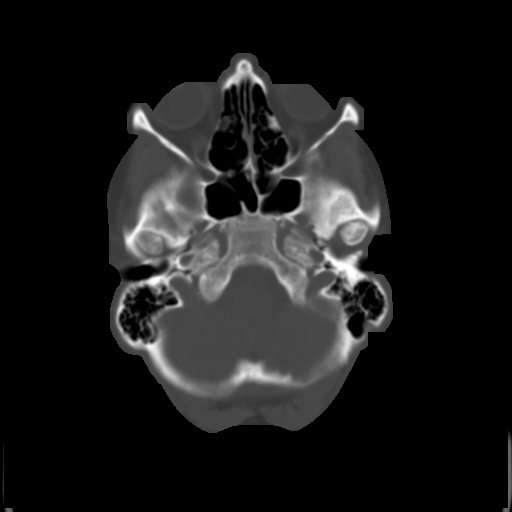
[im 6/29  brain]
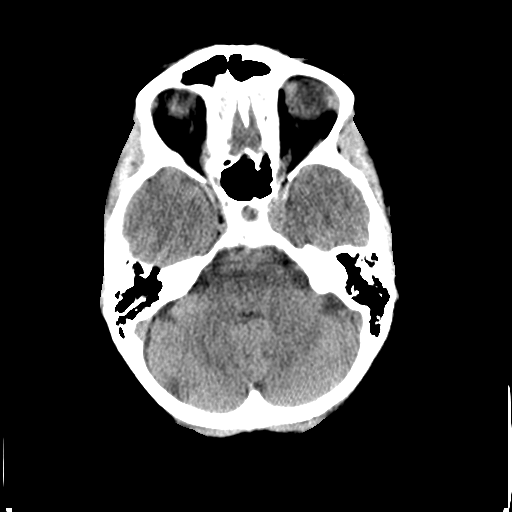
[im 9/29  brain]
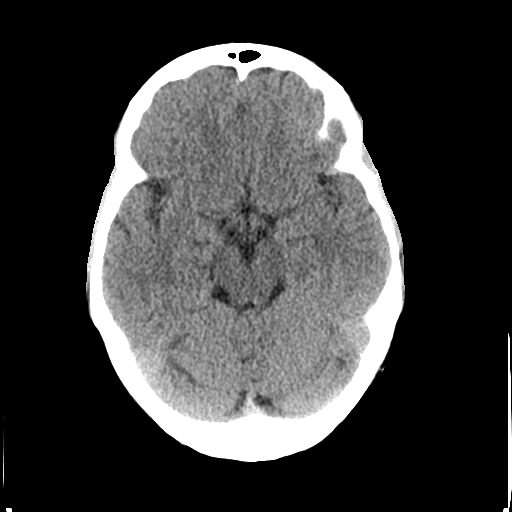
[im 12/29  brain]
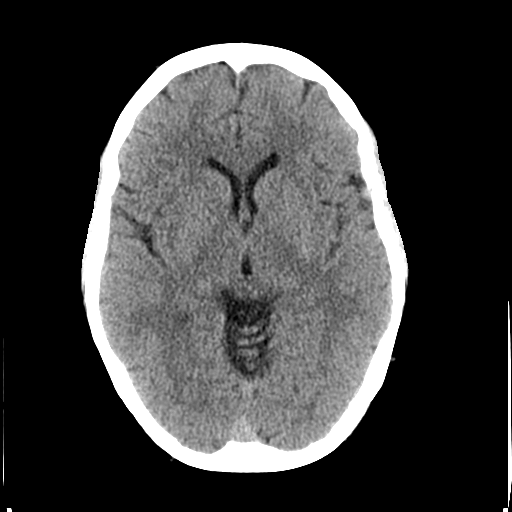
[im 15/29  brain]
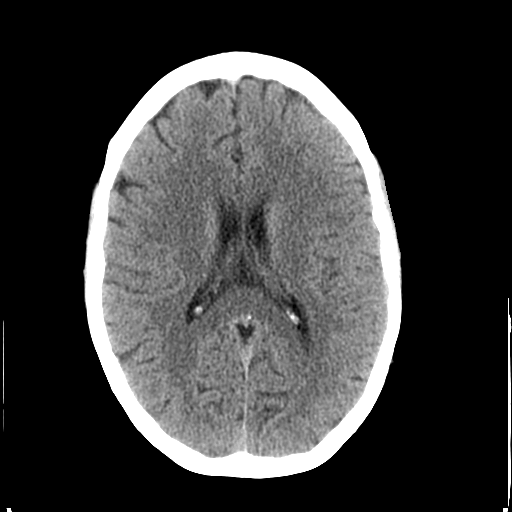
[im 15/29  bone]
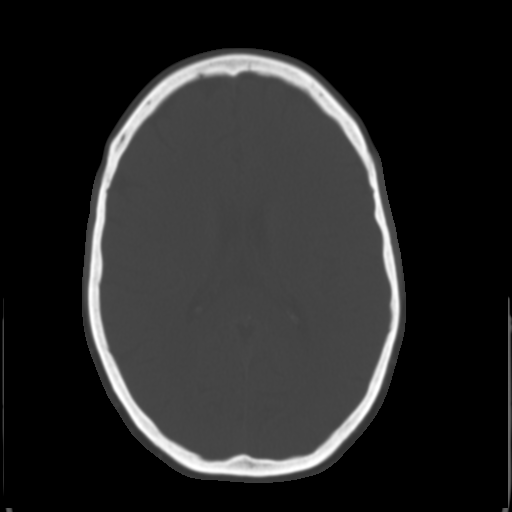
[im 17/29  brain]
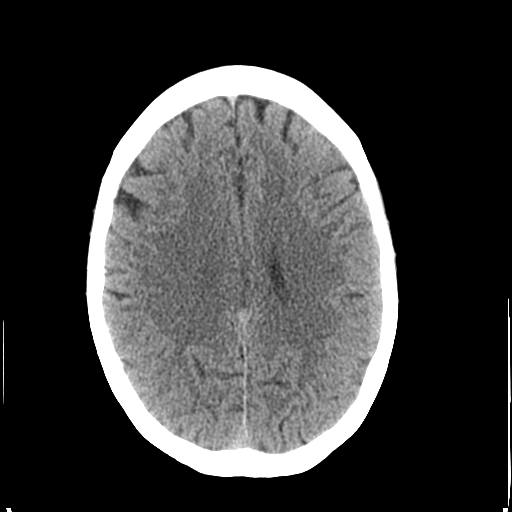
[im 20/29  brain]
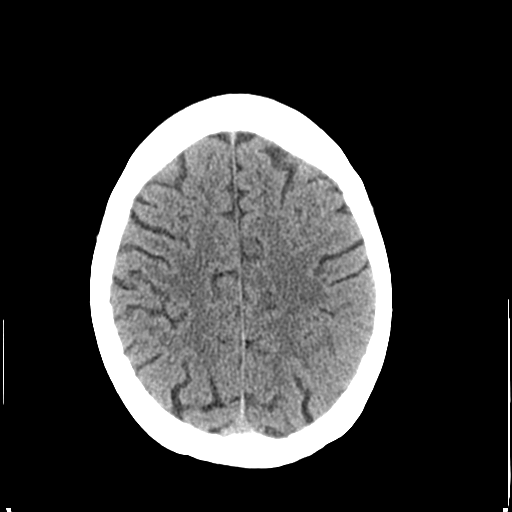
[im 23/29  brain]
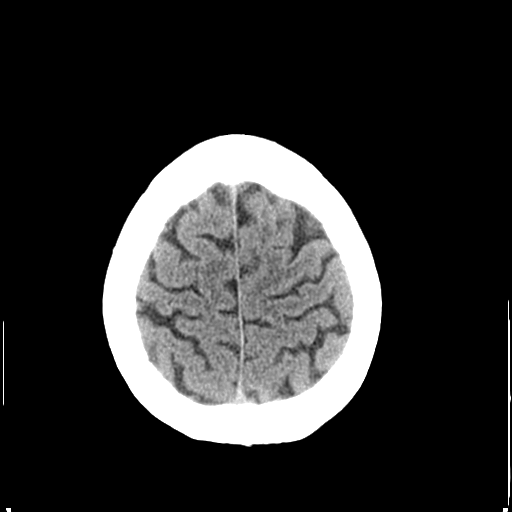
[im 26/29  brain]
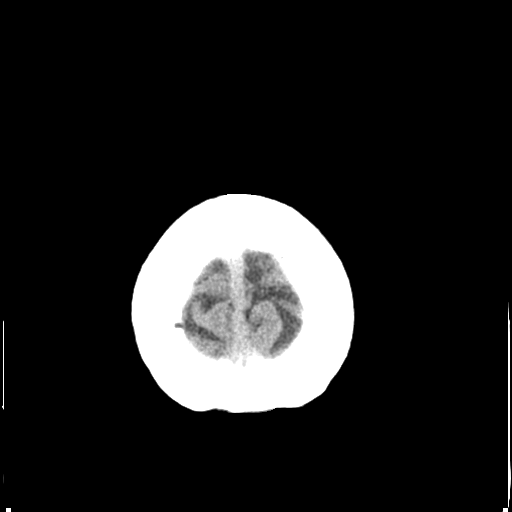
[im 26/29  bone]
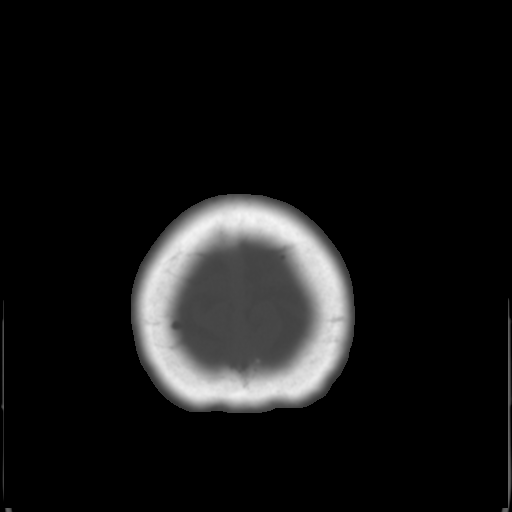

[Series 3: bone windows · axial · 0.44mm/px · z∈[+202,+307]mm · 8 of 47 slices shown]
[im 6/47  bone]
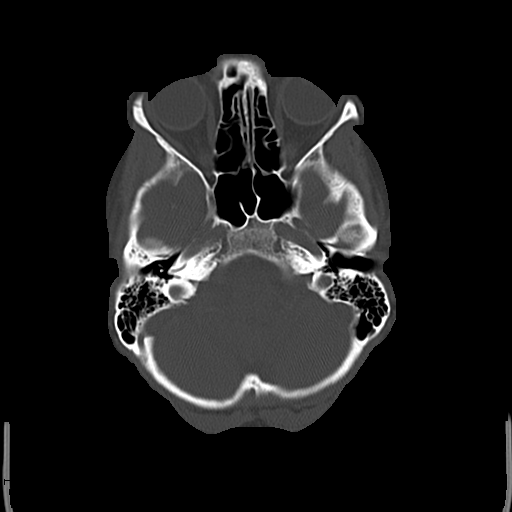
[im 11/47  bone]
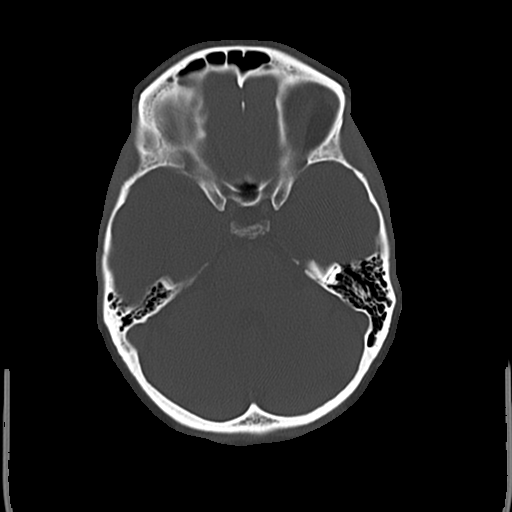
[im 16/47  bone]
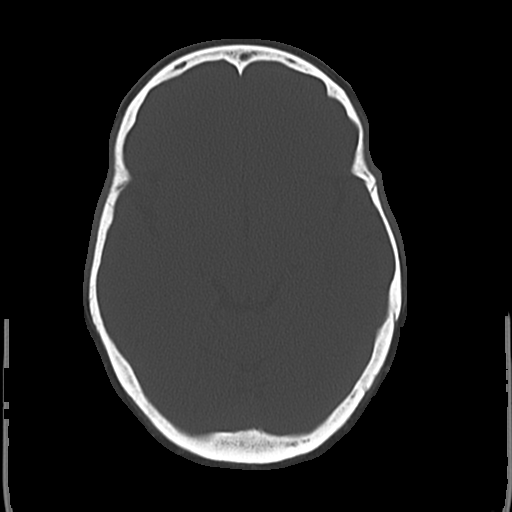
[im 21/47  bone]
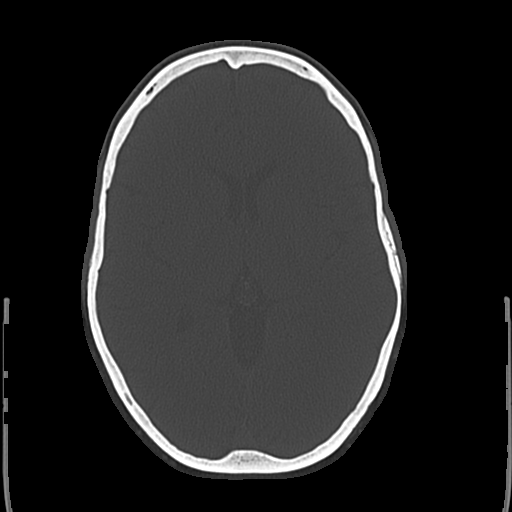
[im 26/47  bone]
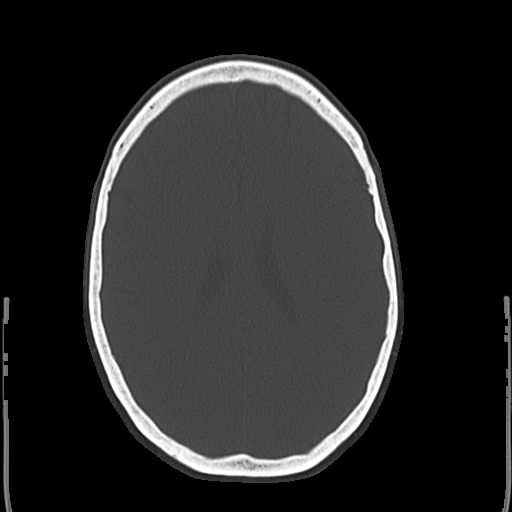
[im 31/47  bone]
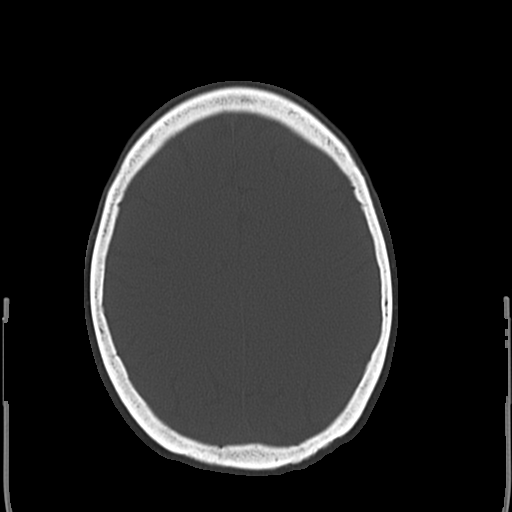
[im 36/47  bone]
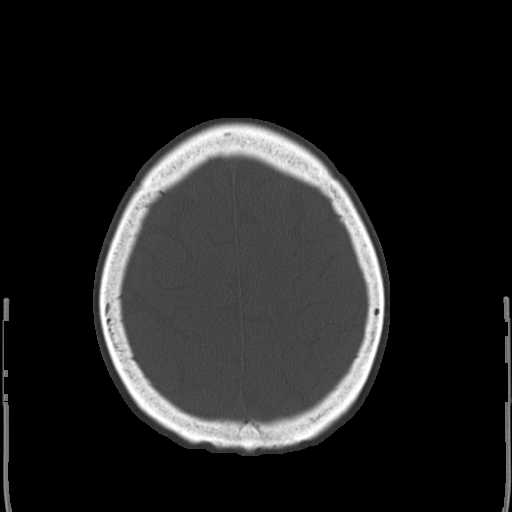
[im 41/47  bone]
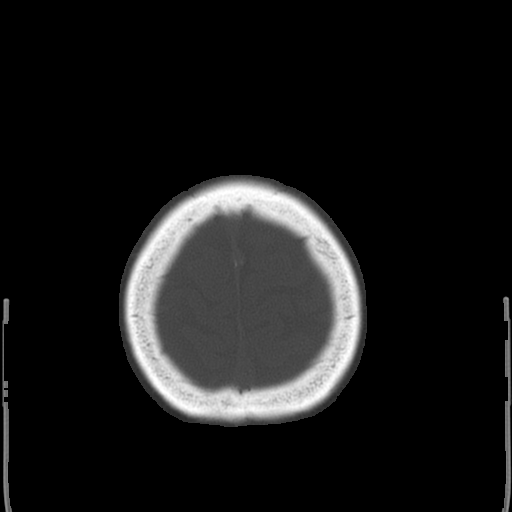

[17 of 30 positions shown; findings below may reference images not displayed]

FINDINGS: Ventricle size is normal. Negative for acute or chronic infarction.
Negative for hemorrhage or fluid collection. Negative for mass or
edema. No shift of the midline structures.

Calvarium is intact.
IMPRESSION: Negative

## 2018-01-31 ENCOUNTER — Encounter: Payer: Self-pay | Admitting: Obstetrics and Gynecology

## 2018-02-02 ENCOUNTER — Other Ambulatory Visit: Payer: Self-pay | Admitting: *Deleted

## 2018-02-02 MED ORDER — ESTRADIOL 0.5 MG PO TABS: 0.5000 mg | ORAL_TABLET | Freq: Every day | ORAL | 0 refills | Status: DC

## 2018-02-02 NOTE — Telephone Encounter (Signed)
Incoming fax request received from [facility] for the following:   Medication refill request: estradiol  Last AEX:  04-28-17 BS Next AEX: 05-13-18  Last MMG (if hormonal medication request): 01-10-18 density B/BIRADS 2 benign  Refill authorized: 04-28-17 #90, 3RF.   Note from pharmacy states patient picked up last refill off of prescription dated 04-28-17. "Any refills authorized will be placed on file." Please advise.

## 2018-02-08 ENCOUNTER — Other Ambulatory Visit: Payer: Self-pay | Admitting: Obstetrics and Gynecology

## 2018-02-08 NOTE — Telephone Encounter (Signed)
Medication refill request: Provera 2.5 mg tab  Last AEX:  04/28/17 Next AEX: 05/13/18 Last MMG (if hormonal medication request): 01/31/18 Bi-rads category 2 begin  Refill authorized: Please refill until AEX

## 2018-04-06 ENCOUNTER — Other Ambulatory Visit: Payer: Self-pay | Admitting: Obstetrics and Gynecology

## 2018-04-06 NOTE — Telephone Encounter (Signed)
Medication refill request: Celexa Last AEX: 04/28/17  Next AEX: 05/13/18 Last MMG (if hormonal medication request): 01/10/18 Bi-rads 2 benign Refill authorized: #30 with 0 RF. Please refill until AEX if appropriate.

## 2018-05-08 DIAGNOSIS — Z8619 Personal history of other infectious and parasitic diseases: Secondary | ICD-10-CM

## 2018-05-08 HISTORY — DX: Personal history of other infectious and parasitic diseases: Z86.19

## 2018-05-12 NOTE — Progress Notes (Signed)
57 y.o. [id number] Married Caucasian female here for annual exam.    Wants to loose weight.   On HRT for about 4+ years.  Had emotional changes and joint pain which prompted the HRT. Did not really have a lot of hot flashes.  No bleeding or spotting.  Hot flashes are controlled.  Labs done with her PCP.   PCP:  Marton Redwood, MD  Patient's last menstrual period was 09/16/2013 (exact date).           Sexually active: Yes.   female The current method of family planning is post menopausal status.    Exercising: Yes.    walking Smoker:  no  Health Maintenance: Pap:  03-13-16 Neg:Neg HR HPV, 01-06-13 Neg:Neg HR HPV History of abnormal Pap:  no MMG: 01-10-18 3D Neg--benign vascular calcifications both breasts/density B/BiRads2 Colonoscopy: 04/27/14 normal with Dr.Mann;next in 10 years. BMD: 2018 Result  : on the cusp of Osteopenia--Dr.Shaw TDaP:  12-30-14 Gardasil:   no YHC:[facility] blood 2017 Hep C:donated blood 2017 Screening Labs:  Hb today: PCP Flu vaccine at work.   reports that she has never smoked. She has never used smokeless tobacco. She reports that she drinks about 5.0 standard drinks of alcohol per week. She reports that she does not use drugs.  Past Medical History:  Diagnosis Date  . Anxiety   . HTN (hypertension)   . 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 PONV (postoperative nausea and vomiting)     Past Surgical History:  Procedure Laterality Date  . CHOLECYSTECTOMY N/A 05/12/2013   Procedure: LAPAROSCOPIC CHOLECYSTECTOMY WITH INTRAOPERATIVE CHOLANGIOGRAM;  Surgeon: Earnstine Regal, MD;  Location: [facility];  Service: General;  Laterality: N/A;  . CYSTECTOMY  1999   dermiod cyst  . removal of norplant  1997  . STAPEDECTOMY Right 09/2014  . TONSILLECTOMY AND ADENOIDECTOMY    . 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    Current Outpatient Medications  Medication Sig Dispense Refill  . BIOTIN 5000 PO Take 1 tablet by mouth daily.     . Cholecalciferol (VITAMIN D) 2000 units CAPS Take 1 capsule by mouth daily.    . citalopram (CELEXA) 20 MG tablet TAKE 1 TABLET BY MOUTH EVERY DAY 30 tablet 1  . estradiol (ESTRACE) 0.5 MG tablet Take 1 tablet (0.5 mg total) by mouth daily. 90 tablet 0  . fluticasone (FLONASE) 50 MCG/ACT nasal spray Place 2 sprays into the nose daily as needed for allergies.     Marland Kitchen irbesartan (AVAPRO) 150 MG tablet Take 1 tablet by mouth at bedtime.     . medroxyPROGESTERone (PROVERA) 2.5 MG tablet TAKE 1 TABLET BY MOUTH EVERY DAY 90 tablet 0  . Melatonin 10 MG CAPS Take 1 tablet by mouth at bedtime.    . Multiple Vitamin (MULTIVITAMIN) capsule Take 1 capsule by mouth daily.    . NON FORMULARY Mega Red joint care  --  Takes 1 tablet daily    . Sod Monofluorphosphate-Ca Carb (MONOCAL PO) Take 1 tablet by mouth daily. Monocal    . Wheat Dextrin (BENEFIBER DRINK MIX PO) Take 1 scoop by mouth daily.      No current facility-administered medications for this visit.     Family History  Problem Relation Age of Onset  . Hypertension Mother   . Osteoporosis Mother        after age 68  . Cancer Mother  pancreatic cancer  . Hypertension Father   . Heart disease Father   . Heart attack Father   . Osteoporosis Maternal Grandmother   . Cancer Maternal Grandfather        colon    Review of Systems  All other systems reviewed and are negative.   Exam:   BP 124/72 (BP Location: Right Arm, Patient Position: Sitting, Cuff Size: Normal)   Pulse 70   Resp 14   Ht 5' 6.5" (1.689 m)   Wt 163 lb (73.9 kg)   LMP 09/16/2013 (Exact Date)   BMI 25.91 kg/m     General appearance: alert, cooperative and appears stated age Head: Normocephalic, without obvious abnormality, atraumatic Neck: no adenopathy, supple, symmetrical, trachea midline and thyroid normal to inspection and palpation Lungs: clear to auscultation bilaterally Breasts: normal appearance, no masses or tenderness, No nipple retraction or  dimpling, No nipple discharge or bleeding, No axillary or supraclavicular adenopathy Heart: regular rate and rhythm Abdomen: soft, non-tender; no masses, no organomegaly Extremities: extremities normal, atraumatic, no cyanosis or edema Skin: Skin color, texture, turgor normal. No rashes or lesions Lymph nodes: Cervical, supraclavicular, and axillary nodes normal. No abnormal inguinal nodes palpated Neurologic: Grossly normal  Pelvic: External genitalia:  no lesions              Urethra:  normal appearing urethra with no masses, tenderness or lesions              Bartholins and Skenes: normal                 Vagina: normal appearing vagina with normal color and discharge, no lesions              Cervix: no lesions              Pap taken: No. Bimanual Exam:  Uterus:  normal size, contour, position, consistency, mobility, non-tender              Adnexa: no mass, fullness, tenderness              Rectal exam: Yes.  .  Confirms.              Anus:  normal sphincter tone, no lesions  Chaperone was present for exam.  Assessment:   Well woman visit with normal exam. HRT.  Borderline osteopenia.   Plan: Mammogram screening. Recommended self breast awareness. Pap and HR HPV as above. Guidelines for Calcium, Vitamin D, regular exercise program including cardiovascular and weight bearing exercise. She will wean off HRT over the next 2 months.  She will cut her Estrace 0.5 mg in half and take Provera 2.5 mg daily.   We did discuss the WHI and risks of stroke, DVT, PE, MI and breast cancer.  BMD through PCP.  Follow up annually and prn.   After visit summary provided.

## 2018-05-13 ENCOUNTER — Ambulatory Visit: Payer: Managed Care, Other (non HMO) | Admitting: Obstetrics and Gynecology

## 2018-05-13 ENCOUNTER — Other Ambulatory Visit: Payer: Self-pay

## 2018-05-13 ENCOUNTER — Encounter: Payer: Self-pay | Admitting: Obstetrics and Gynecology

## 2018-05-13 VITALS — BP 124/72 | HR 70 | Resp 14 | Ht 66.5 in | Wt 163.0 lb

## 2018-05-13 DIAGNOSIS — Z01419 Encounter for gynecological examination (general) (routine) without abnormal findings: Secondary | ICD-10-CM

## 2018-05-13 NOTE — Patient Instructions (Signed)

## 2018-05-16 ENCOUNTER — Other Ambulatory Visit: Payer: Self-pay | Admitting: *Deleted

## 2018-05-16 MED ORDER — ESTRADIOL 0.5 MG PO TABS: ORAL_TABLET | ORAL | 0 refills | Status: DC

## 2018-05-16 NOTE — Telephone Encounter (Signed)
Medication refill request: Estradiol 0.5mg   Last AEX:  05-13-18 BS Next AEX: 05-17-19  Last MMG (if hormonal medication request): 01-10-18 3D Neg--benign vascular calcifications both breasts/density B/BiRads2 Refill authorized: Please advise.   Call to patient. Patient states that when she got home on Friday after her aex, she realized she did in fact need a refill of estradiol. Patient states she is aware to be weaning off and will cut the 0.5mg  tablets in half. Advised would send request to Dr. Quincy Simmonds. Pharmacy [facility]. Medication pended for #30, 1 RF. Please advise.

## 2018-08-01 ENCOUNTER — Other Ambulatory Visit: Payer: Self-pay | Admitting: Obstetrics and Gynecology

## 2018-08-01 NOTE — Telephone Encounter (Signed)
Medication refill request: Celexa   Last AEX:  05/13/18 Next AEX: 05/17/19 Last MMG (if hormonal medication request):10/13/17  Bi-rads 2 benign  Refill authorized: #30 with 10 RF

## 2018-08-08 ENCOUNTER — Telehealth: Payer: Self-pay | Admitting: Obstetrics and Gynecology

## 2018-08-08 NOTE — Telephone Encounter (Signed)
Patient is calling regarding lump that she found in her left breast. Patient described it as feeling like "a cluster and feels kind of hard." Patient declines hot to touch, and discharge.

## 2018-08-08 NOTE — Telephone Encounter (Signed)
Spoke with patient. Reports pea size, hard, mobile left breast lump, "possibly a cluster". Noticed this morning. Denies pain, skin changes, nipple discharge, fever/chills. Postmenopausal. Last MMG 01/10/18 at [facility], negative, Bi-rads 2.   Recommended OV for further evaluation, OV scheduled for 3/4 at 8am with Dr. Quincy Simmonds.  Routing to provider for final review. Patient is agreeable to disposition. Will close encounter.

## 2018-08-08 NOTE — Progress Notes (Signed)
GYNECOLOGY  VISIT   HPI: 58 y.o.   Married  Caucasian  female   [id number] with Patient's last menstrual period was 09/16/2013 (exact date).   here for left breast mass x 2 days. Patient denies pain or redness.   Not on any HRT.  Stopped last month.  Occasional hot flash.   Tired all the time.  GYNECOLOGIC HISTORY: Patient's last menstrual period was 09/16/2013 (exact date). Contraception:  Postmenopausal Menopausal hormone therapy:  none Last mammogram: 01-10-18 3D Neg--benign vascular calcifications both breasts/density B/BiRads2 Last pap smear: 03-13-16 Neg:Neg HR HPV, 01-06-13 Neg:Neg HR HPV        OB History    Gravida  3   Para  2   Term  0   Preterm  2   AB  1   Living  2     SAB  0   TAB  1   Ectopic  0   Multiple  0   Live Births  2              Patient Active Problem List   Diagnosis Date Noted  . Biliary dyskinesia 05/03/2013  . Gallbladder polyp 05/03/2013  . History of dermoid cyst excision 10/07/2012    Past Medical History:  Diagnosis Date  . Anxiety   . History of shingles 05/2018  . HTN (hypertension)   . 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 PONV (postoperative nausea and vomiting)     Past Surgical History:  Procedure Laterality Date  . CHOLECYSTECTOMY N/A 05/12/2013   Procedure: LAPAROSCOPIC CHOLECYSTECTOMY WITH INTRAOPERATIVE CHOLANGIOGRAM;  Surgeon: Earnstine Regal, MD;  Location: [facility];  Service: General;  Laterality: N/A;  . CYSTECTOMY  1999   dermiod cyst  . removal of norplant  1997  . STAPEDECTOMY Right 09/2014  . TONSILLECTOMY AND ADENOIDECTOMY    . 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    Current Outpatient Medications  Medication Sig Dispense Refill  . BIOTIN 5000 PO Take 1 tablet by mouth daily.    . Cholecalciferol (VITAMIN D) 2000 units CAPS Take 1 capsule by mouth daily.    . citalopram (CELEXA) 20 MG tablet TAKE 1 TABLET BY MOUTH EVERY DAY 30 tablet 9  . fluticasone  (FLONASE) 50 MCG/ACT nasal spray Place 2 sprays into the nose daily as needed for allergies.     Marland Kitchen irbesartan (AVAPRO) 150 MG tablet Take 1 tablet by mouth at bedtime.     . Multiple Vitamin (MULTIVITAMIN) capsule Take 1 capsule by mouth daily.    . NON FORMULARY Mega Red joint care  --  Takes 1 tablet daily    . Sod Monofluorphosphate-Ca Carb (MONOCAL PO) Take 1 tablet by mouth daily. Monocal    . Wheat Dextrin (BENEFIBER DRINK MIX PO) Take 1 scoop by mouth daily.      No current facility-administered medications for this visit.      ALLERGIES: Bactrim [sulfamethoxazole-trimethoprim] and Cephalexin  Family History  Problem Relation Age of Onset  . Hypertension Mother   . Osteoporosis Mother        after age 79  . Cancer Mother        pancreatic cancer  . Hypertension Father   . Heart disease Father   . Heart attack Father   . Osteoporosis Maternal Grandmother   . Cancer Maternal Grandfather        colon    Social  History   Socioeconomic History  . Marital status: Married    Spouse name: Not on file  . Number of children: Not on file  . Years of education: Not on file  . Highest education level: Not on file  Occupational History  . Not on file  Social Needs  . Financial resource strain: Not on file  . Food insecurity:    Worry: Not on file    Inability: Not on file  . Transportation needs:    Medical: Not on file    Non-medical: Not on file  Tobacco Use  . Smoking status: Never Smoker  . Smokeless tobacco: Never Used  Substance and Sexual Activity  . Alcohol use: Yes    Alcohol/week: 5.0 standard drinks    Types: 5 Standard drinks or equivalent per week    Comment: 5-6 glasses of wine a week  . Drug use: No  . Sexual activity: Yes    Partners: Male    Birth control/protection: Surgical    Comment: vasectomy  Lifestyle  . Physical activity:    Days per week: Not on file    Minutes per session: Not on file  . Stress: Not on file  Relationships  . Social  connections:    Talks on phone: Not on file    Gets together: Not on file    Attends religious service: Not on file    Active member of club or organization: Not on file    Attends meetings of clubs or organizations: Not on file    Relationship status: Not on file  . Intimate partner violence:    Fear of current or ex partner: Not on file    Emotionally abused: Not on file    Physically abused: Not on file    Forced sexual activity: Not on file  Other Topics Concern  . Not on file  Social History Narrative  . Not on file    Review of Systems  All other systems reviewed and are negative.   PHYSICAL EXAMINATION:    BP 118/76 (BP Location: Right Arm, Patient Position: Sitting, Cuff Size: Normal)   Pulse 80   Ht 5' 6.5" (1.689 m)   Wt 160 lb 9.6 oz (72.8 kg)   LMP 09/16/2013 (Exact Date)   BMI 25.53 kg/m     General appearance: alert, cooperative and appears stated age  Breasts: left  normal appearance, subtle mass at 8:00 - 1 cm, no tenderness, No nipple retraction or dimpling, No nipple discharge or bleeding, No axillary or adenopathy right - normal appearance, no mass, no tenderness, No nipple retraction or dimpling, No nipple discharge or bleeding, No axillary or adenopathy  Chaperone was present for exam.  ASSESSMENT  Left breast mass.  Off HRT.   PLAN  Discussion of management of breast masses. Dx mammogram and left breast US at [facility]    An After Visit Summary was printed and given to the patient.  __15____ minutes face to face time of which over 50% was spent in counseling.

## 2018-08-10 ENCOUNTER — Encounter: Payer: Self-pay | Admitting: Obstetrics and Gynecology

## 2018-08-10 ENCOUNTER — Other Ambulatory Visit: Payer: Self-pay

## 2018-08-10 ENCOUNTER — Ambulatory Visit (INDEPENDENT_AMBULATORY_CARE_PROVIDER_SITE_OTHER): Payer: Managed Care, Other (non HMO) | Admitting: Obstetrics and Gynecology

## 2018-08-10 VITALS — BP 118/76 | HR 80 | Ht 66.5 in | Wt 160.6 lb

## 2018-08-10 DIAGNOSIS — N6320 Unspecified lump in the left breast, unspecified quadrant: Secondary | ICD-10-CM | POA: Diagnosis not present

## 2018-08-10 NOTE — Progress Notes (Signed)
Patient scheduled while in office for left breast Dx MMg and [name], if needed, at [facility] on 08/11/18 at [place], arriving at 0730. Copy of order given to patient to take with her to appt. Patient verbalizes understanding and is agreeable.

## 2018-08-31 ENCOUNTER — Telehealth: Payer: Self-pay | Admitting: Obstetrics and Gynecology

## 2018-08-31 NOTE — Telephone Encounter (Signed)
Spoke with patient. Results given. Patient verbalizes understanding. Follow up appointment scheduled for 10/12/2018 at 4 pm with Dr.Silva. Patient is agreeable to date and time. Encounter closed.

## 2018-08-31 NOTE — Telephone Encounter (Signed)
Patients dx mammogram and ultrasound on 08/11/18 at [facility] for left breast were normal.  She had a subtle left breast lump when I saw her on 08/10/18.  Please schedule a breast recheck with me.

## 2018-08-31 NOTE — Telephone Encounter (Signed)
Left message to call Kaitlyn at [phone]. 

## 2018-10-04 ENCOUNTER — Telehealth: Payer: Self-pay | Admitting: Obstetrics and Gynecology

## 2018-10-04 NOTE — Telephone Encounter (Signed)
Patient cancelled her breast recheck. She is fine and will call back if she need to come in.

## 2018-10-06 NOTE — Telephone Encounter (Signed)
Please contact patient to reschedule her breast check.  I will need another copy of her imaging.   It had not been scanned in the last I checked.   [place]

## 2018-10-07 NOTE — Telephone Encounter (Signed)
Spoke with patient. Breast recheck scheduled for 11/07/2018 at 2:30 pm with Dr.Silva. Patient declines earlier appointment due to her work schedule and COVID 19 state of emergency.  Routing to provider and will close encounter.

## 2018-10-12 ENCOUNTER — Ambulatory Visit: Payer: Managed Care, Other (non HMO) | Admitting: Obstetrics and Gynecology

## 2018-11-07 ENCOUNTER — Ambulatory Visit: Payer: Managed Care, Other (non HMO) | Admitting: Obstetrics and Gynecology

## 2018-11-07 ENCOUNTER — Other Ambulatory Visit: Payer: Self-pay

## 2018-11-07 ENCOUNTER — Encounter: Payer: Self-pay | Admitting: Obstetrics and Gynecology

## 2018-11-07 VITALS — BP 122/68 | HR 76 | Temp 97.5°F | Resp 12 | Ht 67.0 in | Wt 161.0 lb

## 2018-11-07 DIAGNOSIS — N6320 Unspecified lump in the left breast, unspecified quadrant: Secondary | ICD-10-CM | POA: Diagnosis not present

## 2018-11-07 NOTE — Progress Notes (Signed)
GYNECOLOGY  VISIT   HPI: 58 y.o.   Married  Caucasian  female   [id number] with Patient's last menstrual period was 09/16/2013 (exact date).   here for breast recheck.  Seen 08/10/18 for a left breast mass.  Had a 1 cm mass at 8:00, subtle.   Had dx mammogram and [name] left breast which were normal at [facility] on 08/11/18.   Not on HRT.  Is a Pharmacist, hospital.   GYNECOLOGIC HISTORY: Patient's last menstrual period was 09/16/2013 (exact date). Contraception:  Postmenopausal Menopausal hormone therapy:  none Last mammogram:  08/11/18 Left Breast Ultrasound - BIRADS 1 negative Last pap smear:   03-13-16 Neg:Neg HR HPV, 01-06-13 Neg:Neg HR HPV        OB History    Gravida  3   Para  2   Term  0   Preterm  2   AB  1   Living  2     SAB  0   TAB  1   Ectopic  0   Multiple  0   Live Births  2              Patient Active Problem List   Diagnosis Date Noted  . Biliary dyskinesia 05/03/2013  . Gallbladder polyp 05/03/2013  . History of dermoid cyst excision 10/07/2012    Past Medical History:  Diagnosis Date  . Anxiety   . History of shingles 05/2018  . HTN (hypertension)   . 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 PONV (postoperative nausea and vomiting)     Past Surgical History:  Procedure Laterality Date  . CHOLECYSTECTOMY N/A 05/12/2013   Procedure: LAPAROSCOPIC CHOLECYSTECTOMY WITH INTRAOPERATIVE CHOLANGIOGRAM;  Surgeon: Earnstine Regal, MD;  Location: [facility];  Service: General;  Laterality: N/A;  . CYSTECTOMY  1999   dermiod cyst  . removal of norplant  1997  . STAPEDECTOMY Right 09/2014  . TONSILLECTOMY AND ADENOIDECTOMY    . 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    Current Outpatient Medications  Medication Sig Dispense Refill  . BIOTIN 5000 PO Take 1 tablet by mouth daily.    . calcium carbonate (TUMS - DOSED IN MG ELEMENTAL CALCIUM) 500 MG chewable tablet Chew 1 tablet by mouth daily.    . Cholecalciferol (VITAMIN D) 2000  units CAPS Take 1 capsule by mouth daily.    . citalopram (CELEXA) 20 MG tablet TAKE 1 TABLET BY MOUTH EVERY DAY 30 tablet 9  . fluticasone (FLONASE) 50 MCG/ACT nasal spray Place 2 sprays into the nose daily as needed for allergies.     Marland Kitchen irbesartan (AVAPRO) 150 MG tablet Take 1 tablet by mouth at bedtime.     . Multiple Vitamin (MULTIVITAMIN) capsule Take 1 capsule by mouth daily.    . NON FORMULARY Mega Red joint care  --  Takes 1 tablet daily    . Wheat Dextrin (BENEFIBER DRINK MIX PO) Take 1 scoop by mouth daily.      No current facility-administered medications for this visit.      ALLERGIES: Bactrim [sulfamethoxazole-trimethoprim] and Cephalexin  Family History  Problem Relation Age of Onset  . Hypertension Mother   . Osteoporosis Mother        after age 37  . Cancer Mother        pancreatic cancer  . Hypertension Father   . Heart disease Father   . Heart attack Father   .  Osteoporosis Maternal Grandmother   . Cancer Maternal Grandfather        colon    Social History   Socioeconomic History  . Marital status: Married    Spouse name: Not on file  . Number of children: Not on file  . Years of education: Not on file  . Highest education level: Not on file  Occupational History  . Not on file  Social Needs  . Financial resource strain: Not on file  . Food insecurity:    Worry: Not on file    Inability: Not on file  . Transportation needs:    Medical: Not on file    Non-medical: Not on file  Tobacco Use  . Smoking status: Never Smoker  . Smokeless tobacco: Never Used  Substance and Sexual Activity  . Alcohol use: Yes    Alcohol/week: 5.0 standard drinks    Types: 5 Standard drinks or equivalent per week    Comment: 5-6 glasses of wine a week  . Drug use: No  . Sexual activity: Yes    Partners: Male    Birth control/protection: Surgical    Comment: vasectomy  Lifestyle  . Physical activity:    Days per week: Not on file    Minutes per session: Not on  file  . Stress: Not on file  Relationships  . Social connections:    Talks on phone: Not on file    Gets together: Not on file    Attends religious service: Not on file    Active member of club or organization: Not on file    Attends meetings of clubs or organizations: Not on file    Relationship status: Not on file  . Intimate partner violence:    Fear of current or ex partner: Not on file    Emotionally abused: Not on file    Physically abused: Not on file    Forced sexual activity: Not on file  Other Topics Concern  . Not on file  Social History Narrative  . Not on file    Review of Systems  Constitutional: Negative.   HENT: Negative.   Eyes: Negative.   Respiratory: Negative.   Cardiovascular: Negative.   Gastrointestinal: Negative.   Endocrine: Negative.   Genitourinary: Negative.   Musculoskeletal: Negative.   Skin: Negative.   Allergic/Immunologic: Negative.   Neurological: Negative.   Hematological: Negative.   Psychiatric/Behavioral: Negative.     PHYSICAL EXAMINATION:    Temp (!) 97.5 F (36.4 C) (Temporal)   Ht 5\' 7"  (1.702 m)   Wt 161 lb (73 kg)   LMP 09/16/2013 (Exact Date)   BMI 25.22 kg/m     General appearance: alert, cooperative and appears stated age   Breasts: normal appearance, no masses or tenderness, No nipple retraction or dimpling, No nipple discharge or bleeding, No axillary or supraclavicular adenopathy   Chaperone was present for exam.  ASSESSMENT  Left breast mass - resolved.  Reassuring examination.   PLAN  No further evaluation needed.  FU for routine mammogram screening in September, 2020.  FU prn.    An After Visit Summary was printed and given to the patient.  ___15__ minutes face to face time of which over 50% was spent in counseling.

## 2018-11-08 ENCOUNTER — Encounter: Payer: Self-pay | Admitting: Obstetrics and Gynecology

## 2019-01-12 ENCOUNTER — Encounter: Payer: Self-pay | Admitting: Obstetrics and Gynecology

## 2019-05-15 ENCOUNTER — Other Ambulatory Visit: Payer: Self-pay

## 2019-05-16 NOTE — Progress Notes (Signed)
58 y.o. [id number] Married Caucasian female here for annual exam.    No vaginal dryness.  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 She and her husband just bought bikes.   PCP:  Marton Redwood, MD   Patient's last menstrual period was 09/16/2013 (exact date).           Sexually active: Yes.    The current method of family planning is post menopausal status.    Exercising: Yes.    walking and biking Smoker:  no  Health Maintenance: Pap: 03-13-16 Neg:Neg HR HPV, 01-06-13 Neg:Neg HR HPV History of abnormal Pap:  no MMG: 01-12-19 Neg/density B/BiRads1 Colonoscopy: 04/27/14 normal;next 04/2024 BMD: 2018  Result:  on cusp of Osteopenia--Dr.Shaw TDaP:  12-30-14 Gardasil:   no [id number] blood 2017 Hep C:Donated blood 2017 Screening Labs:  PCP.  Flu vaccine:  Completed.    reports that she has never smoked. She has never used smokeless tobacco. She reports current alcohol use of about 5.0 standard drinks of alcohol per week. She reports that she does not use drugs.  Past Medical History:  Diagnosis Date  . Anxiety   . History of shingles 05/2018  . HTN (hypertension)   . 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 PONV (postoperative nausea and vomiting)     Past Surgical History:  Procedure Laterality Date  . CHOLECYSTECTOMY N/A 05/12/2013   Procedure: LAPAROSCOPIC CHOLECYSTECTOMY WITH INTRAOPERATIVE CHOLANGIOGRAM;  Surgeon: Earnstine Regal, MD;  Location: [facility];  Service: General;  Laterality: N/A;  . CYSTECTOMY  1999   dermiod cyst  . removal of norplant  1997  . STAPEDECTOMY Right 09/2014  . TONSILLECTOMY AND ADENOIDECTOMY    . 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    Current Outpatient Medications  Medication Sig Dispense Refill  . Ascorbic Acid (VITAMIN C) 1000 MG tablet Take 1,000 mg by mouth daily.    Marland Kitchen BIOTIN 5000 PO Take 1 tablet by mouth daily.    . calcium carbonate (TUMS - DOSED IN MG ELEMENTAL CALCIUM) 500 MG  chewable tablet Chew 1 tablet by mouth daily.    . Cholecalciferol (VITAMIN D) 2000 units CAPS Take 1 capsule by mouth daily.    . citalopram (CELEXA) 40 MG tablet Take 40 mg by mouth daily.    . clonazePAM (KLONOPIN) 0.5 MG tablet Take 0.25-0.5 mg by mouth 2 (two) times daily as needed.    . diphenhydrAMINE (BENADRYL) 25 MG tablet Take 25 mg by mouth at bedtime as needed.    . fluticasone (FLONASE) 50 MCG/ACT nasal spray Place 2 sprays into the nose daily as needed for allergies.     Marland Kitchen glucosamine-chondroitin 500-400 MG tablet Take 1 tablet by mouth 2 (two) times daily.    . irbesartan (AVAPRO) 150 MG tablet Take 1 tablet by mouth at bedtime.     Marland Kitchen loratadine (CLARITIN) 10 MG tablet Take 10 mg by mouth daily as needed for allergies.    . Multiple Vitamin (MULTIVITAMIN) capsule Take 1 capsule by mouth daily.    . Wheat Dextrin (BENEFIBER DRINK MIX PO) Take 1 scoop by mouth daily.      No current facility-administered medications for this visit.     Family History  Problem Relation Age of Onset  . Hypertension Mother   . Osteoporosis Mother        after age 38  . Cancer Mother  pancreatic cancer  . Hypertension Father   . Heart disease Father   . Heart attack Father   . Osteoporosis Maternal Grandmother   . Cancer Maternal Grandfather        colon    Review of Systems  All other systems reviewed and are negative.   Exam:   BP 118/78   Pulse 76   Temp 97.6 F (36.4 C) (Temporal)   Resp 16   Ht 5\' 7"  (1.702 m)   Wt 160 lb 3.2 oz (72.7 kg)   LMP 09/16/2013 (Exact Date)   BMI 25.09 kg/m     General appearance: alert, cooperative and appears stated age Head: normocephalic, without obvious abnormality, atraumatic Neck: no adenopathy, supple, symmetrical, trachea midline and thyroid normal to inspection and palpation Lungs: clear to auscultation bilaterally Breasts: normal appearance, no masses or tenderness, No nipple retraction or dimpling, No nipple discharge or  bleeding, No axillary adenopathy Heart: regular rate and rhythm Abdomen: soft, non-tender; no masses, no organomegaly Extremities: extremities normal, atraumatic, no cyanosis or edema Skin: skin color, texture, turgor normal. No rashes or lesions Lymph nodes: cervical, supraclavicular, and axillary nodes normal. Neurologic: grossly normal  Pelvic: External genitalia:  no lesions              No abnormal inguinal nodes palpated.              Urethra:  normal appearing urethra with no masses, tenderness or lesions              Bartholins and Skenes: normal                 Vagina: normal appearing vagina with normal color and discharge, no lesions.               Cervix: no lesions              Pap taken: No. Bimanual Exam:  Uterus:  normal size, contour, position, consistency, mobility, non-tender.  4 -5 mm cyst palpable to the right of the cervix at vaginal apex, nontender.  Not visible.              Adnexa: no mass, fullness, tenderness              Rectal exam: Yes.  .  Confirms.              Anus:  normal sphincter tone, no lesions  Chaperone was present for exam.  Assessment:   Well woman visit with normal exam. Vaginal cyst to the right of cervix 4 - 5 mm, nontender.  Plan: Mammogram screening discussed. Self breast awareness reviewed. Pap and HR HPV 2022.  New guidelines discussed.  Guidelines for Calcium, Vitamin D, regular exercise program including cardiovascular and weight bearing exercise. Will follow the vaginal cyst clinically.  BMD through PCP.  Labs with PCP. Follow up annually and prn.    After visit summary provided.

## 2019-05-17 ENCOUNTER — Ambulatory Visit (INDEPENDENT_AMBULATORY_CARE_PROVIDER_SITE_OTHER): Payer: Managed Care, Other (non HMO) | Admitting: Obstetrics and Gynecology

## 2019-05-17 ENCOUNTER — Other Ambulatory Visit: Payer: Self-pay

## 2019-05-17 ENCOUNTER — Encounter: Payer: Self-pay | Admitting: Obstetrics and Gynecology

## 2019-05-17 VITALS — BP 118/78 | HR 76 | Temp 97.6°F | Resp 16 | Ht 67.0 in | Wt 160.2 lb

## 2019-05-17 DIAGNOSIS — Z01419 Encounter for gynecological examination (general) (routine) without abnormal findings: Secondary | ICD-10-CM

## 2019-05-17 NOTE — Patient Instructions (Signed)

## 2019-08-05 ENCOUNTER — Ambulatory Visit: Payer: Managed Care, Other (non HMO) | Attending: Internal Medicine

## 2019-08-05 DIAGNOSIS — Z23 Encounter for immunization: Secondary | ICD-10-CM

## 2019-08-05 NOTE — Progress Notes (Signed)
   Covid-19 Vaccination Clinic  Name:  Ariel Nicholson    MRN: [id number] DOB: September 13, 1960  08/05/2019  Ariel Nicholson was observed post Covid-19 immunization for 15 minutes without incidence. She was provided with Vaccine Information Sheet and instruction to access the V-Safe system.   Ariel Nicholson was instructed to call 911 with any severe reactions post vaccine: Marland Kitchen Difficulty breathing  . Swelling of your face and throat  . A fast heartbeat  . A bad rash all over your body  . Dizziness and weakness    Immunizations Administered    Name Date Dose VIS Date Route   Pfizer COVID-19 Vaccine 08/05/2019 11:23 AM 0.3 mL 05/19/2019 Intramuscular   Manufacturer: [place]   Lot: [id number]   [place]: [id number]

## 2019-08-26 ENCOUNTER — Ambulatory Visit: Payer: Managed Care, Other (non HMO) | Attending: Internal Medicine

## 2019-08-26 DIAGNOSIS — Z23 Encounter for immunization: Secondary | ICD-10-CM

## 2019-08-26 NOTE — Progress Notes (Signed)
   Covid-19 Vaccination Clinic  Name:  Nameera Abanto    MRN: [id number] DOB: 11/29/1960  08/26/2019  Ms. Homstad was observed post Covid-19 immunization for 15 minutes without incident. She was provided with Vaccine Information Sheet and instruction to access the V-Safe system.   Ms. Vaccarello was instructed to call 911 with any severe reactions post vaccine: Marland Kitchen Difficulty breathing  . Swelling of face and throat  . A fast heartbeat  . A bad rash all over body  . Dizziness and weakness   Immunizations Administered    Name Date Dose VIS Date Route   Pfizer COVID-19 Vaccine 08/26/2019 10:42 AM 0.3 mL 05/19/2019 Intramuscular   Manufacturer: [place]   Lot: [id number]   [place]: [id number]

## 2019-08-30 ENCOUNTER — Ambulatory Visit: Payer: Managed Care, Other (non HMO)

## 2020-03-08 ENCOUNTER — Encounter: Payer: Self-pay | Admitting: Obstetrics and Gynecology

## 2020-05-21 ENCOUNTER — Other Ambulatory Visit: Payer: Self-pay

## 2020-05-21 ENCOUNTER — Ambulatory Visit: Payer: Managed Care, Other (non HMO) | Admitting: Obstetrics and Gynecology

## 2020-05-21 ENCOUNTER — Encounter: Payer: Self-pay | Admitting: Obstetrics and Gynecology

## 2020-05-21 VITALS — BP 130/68 | HR 75 | Ht 66.5 in | Wt 167.0 lb

## 2020-05-21 DIAGNOSIS — Z01419 Encounter for gynecological examination (general) (routine) without abnormal findings: Secondary | ICD-10-CM

## 2020-05-21 NOTE — Patient Instructions (Signed)

## 2020-05-21 NOTE — Progress Notes (Signed)
59 y.o. [id number] Married Caucasian female here for annual exam.    Having some joint pain, comes and goes, mostly in her knees.  Now having some left hip pain.  Achilles tendon is uncomfortable. Naproxen gave her GI upset, and this is not working.  Taking glucosamine and collagen peptides.  Did her Covid booster and flu vaccine.  71 year old daughter has moved back home.   PCP: Janalyn Rouse, MD   Patient's last menstrual period was 09/16/2013 (exact date).           Sexually active: Yes.    The current method of family planning is Vasectomy/post menopausal status.    Exercising: No.  trying to power walk when she can Smoker:  no  Health Maintenance: Pap: 03-13-16 Neg:Neg HR HPV, 01-06-13 Neg:Neg HR HPV History of abnormal Pap:  no MMG: 03-08-20 - [facility] - BI-RADS1, cat B density.  Colonoscopy: 04/27/14 normal;next 04/2024 BMD: 2018  Result :on cusp of Osteopenia--Dr.Shaw TDaP:  12-30-14 Gardasil:   no HIV: donated blood 2017 Hep C: donated blood 2017 Screening Labs:  PCP.    reports that she has never smoked. She has never used smokeless tobacco. She reports current alcohol use of about 5.0 standard drinks of alcohol per week. She reports that she does not use drugs.  Past Medical History:  Diagnosis Date  . Anxiety   . History of shingles 05/2018  . HTN (hypertension)   . 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 PONV (postoperative nausea and vomiting)     Past Surgical History:  Procedure Laterality Date  . CHOLECYSTECTOMY N/A 05/12/2013   Procedure: LAPAROSCOPIC CHOLECYSTECTOMY WITH INTRAOPERATIVE CHOLANGIOGRAM;  Surgeon: Earnstine Regal, MD;  Location: [facility];  Service: General;  Laterality: N/A;  . CYSTECTOMY  1999   dermiod cyst  . removal of norplant  1997  . STAPEDECTOMY Right 09/2014  . TONSILLECTOMY AND ADENOIDECTOMY    . 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    Current Outpatient Medications  Medication Sig Dispense  Refill  . Ascorbic Acid (VITAMIN C) 1000 MG tablet Take 1,000 mg by mouth daily.    . calcium carbonate (TUMS - DOSED IN MG ELEMENTAL CALCIUM) 500 MG chewable tablet Chew 1 tablet by mouth daily.    . Cholecalciferol (VITAMIN D) 2000 units CAPS Take 1 capsule by mouth daily.    . citalopram (CELEXA) 40 MG tablet Take 40 mg by mouth daily.    . clonazePAM (KLONOPIN) 0.5 MG tablet Take 0.25-0.5 mg by mouth 2 (two) times daily as needed.    . COLLAGEN PO Take 1 tablet by mouth at bedtime.    . diphenhydrAMINE (BENADRYL) 25 MG tablet Take 25 mg by mouth at bedtime as needed.    . fluticasone (FLONASE) 50 MCG/ACT nasal spray Place 2 sprays into the nose daily as needed for allergies.     Marland Kitchen glucosamine-chondroitin 500-400 MG tablet Take 1 tablet by mouth 2 (two) times daily.    . irbesartan (AVAPRO) 150 MG tablet Take 1 tablet by mouth at bedtime.     Marland Kitchen loratadine (CLARITIN) 10 MG tablet Take 10 mg by mouth daily as needed for allergies.    . Multiple Vitamin (MULTIVITAMIN) capsule Take 1 capsule by mouth daily.    . naproxen sodium (ALEVE) 220 MG tablet Take 220 mg by mouth 2 (two) times daily.    . Wheat Dextrin ([place] MIX  PO) Take 1 scoop by mouth daily.      No current facility-administered medications for this visit.    Family History  Problem Relation Age of Onset  . Hypertension Mother   . Osteoporosis Mother        after age 94  . Cancer Mother        pancreatic cancer  . Hypertension Father   . Heart disease Father   . Heart attack Father   . Osteoporosis Maternal Grandmother   . Cancer Maternal Grandfather        colon    Review of Systems  Musculoskeletal:       Joint aches  All other systems reviewed and are negative.   Exam:   BP 130/68   Pulse 75   Ht 5' 6.5" (1.689 m)   Wt 167 lb (75.8 kg)   LMP 09/16/2013 (Exact Date)   SpO2 98%   BMI 26.55 kg/m     General appearance: alert, cooperative and appears stated age Head: normocephalic, without obvious  abnormality, atraumatic Neck: no adenopathy, supple, symmetrical, trachea midline and thyroid normal to inspection and palpation Lungs: clear to auscultation bilaterally Breasts: normal appearance, no masses or tenderness, No nipple retraction or dimpling, No nipple discharge or bleeding, No axillary adenopathy Heart: regular rate and rhythm Abdomen: soft, non-tender; no masses, no organomegaly Extremities: extremities normal, atraumatic, no cyanosis or edema Skin: skin color, texture, turgor normal. No rashes or lesions Lymph nodes: cervical, supraclavicular, and axillary nodes normal. Neurologic: grossly normal  Pelvic: External genitalia:  no lesions              No abnormal inguinal nodes palpated.              Urethra:  normal appearing urethra with no masses, tenderness or lesions              Bartholins and Skenes: normal                 Vagina: normal appearing vagina with normal color and discharge, no lesions              Cervix: no lesions              Pap taken: No. Bimanual Exam:  Uterus:  normal size, contour, position, consistency, mobility, non-tender              Adnexa: no mass, fullness, tenderness              Rectal exam: Yes.  .  Confirms.              Anus:  normal sphincter tone, no lesions  Chaperone was present for exam.  Assessment:   Well woman visit with normal exam. Joint pain.   Plan: Mammogram screening discussed. Self breast awareness reviewed. Pap and HR HPV as above. Guidelines for Calcium, Vitamin D, regular exercise program including cardiovascular and weight bearing exercise. Patient with follow up with her PCP regarding her continued joint pain.  Follow up annually and prn.

## 2021-01-25 NOTE — Progress Notes (Unsigned)
Erroneous encounter.  I do not perform sleep studies.

## 2021-01-25 NOTE — Progress Notes (Deleted)
Erroneous encounter. Please disregard.

## 2021-03-13 ENCOUNTER — Encounter: Payer: Self-pay | Admitting: Obstetrics and Gynecology

## 2021-03-24 ENCOUNTER — Telehealth: Payer: Managed Care, Other (non HMO) | Admitting: Physician Assistant

## 2021-03-24 DIAGNOSIS — B9689 Other specified bacterial agents as the cause of diseases classified elsewhere: Secondary | ICD-10-CM

## 2021-03-24 DIAGNOSIS — J069 Acute upper respiratory infection, unspecified: Secondary | ICD-10-CM

## 2021-03-24 MED ORDER — BENZONATATE 100 MG PO CAPS: 100.0000 mg | ORAL_CAPSULE | Freq: Three times a day (TID) | ORAL | 0 refills | Status: DC | PRN

## 2021-03-24 MED ORDER — AZITHROMYCIN 250 MG PO TABS: ORAL_TABLET | ORAL | 0 refills | Status: DC

## 2021-03-24 NOTE — Progress Notes (Signed)

## 2021-06-11 NOTE — Progress Notes (Signed)
61 y.o. [id number] Married Caucasian female here for annual exam.    Has knee problems that limit her physical activity. 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   Some stress incontinence.   PCP: Lang Snow, MD  Patient's last menstrual period was 09/16/2013 (exact date).           Sexually active: No.  The current method of family planning is vasectomy/PMP.    Exercising: Yes.     walking Smoker:  no  Health Maintenance: Pap: 03-13-16 Neg:Neg HR HPV, 01-06-13 Neg:Neg HR HPV  History of abnormal Pap:  no MMG:  03-13-21 Neg/BiRads1 Colonoscopy:   04/27/14 normal;next 04/2024 BMD: 2018  Result :on cusp of Osteopenia--Dr.Shaw TDaP:  12-30-14 Gardasil:   no HIV: donated blood 2017 Hep C: donated blood 2017 Screening Labs:  PCP. Flu vaccine:  completed.  Covid vaccine:  2 boosters completed.  Shingrix:  has plans to do this.   reports that she has never smoked. She has never used smokeless tobacco. She reports current alcohol use of about 3.0 standard drinks per week. She reports that she does not use drugs.  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)    some in left ear   PONV (postoperative nausea and vomiting)     Past Surgical History:  Procedure Laterality Date   CHOLECYSTECTOMY N/A 05/12/2013   Procedure: LAPAROSCOPIC CHOLECYSTECTOMY WITH INTRAOPERATIVE CHOLANGIOGRAM;  Surgeon: Earnstine Regal, MD;  Location: [facility];  Service: General;  Laterality: N/A;   CYSTECTOMY  1999   dermiod cyst   removal of norplant  1997   STAPEDECTOMY Right 09/2014   TONSILLECTOMY AND ADENOIDECTOMY     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     calcium carbonate (TUMS - DOSED IN MG ELEMENTAL CALCIUM) 500 MG chewable tablet Chew 1 tablet by mouth  daily.     cetirizine (ZYRTEC) 10 MG tablet Take 10 mg by mouth daily.     Cholecalciferol (VITAMIN D) 2000 units CAPS Take 1 capsule by mouth daily.     citalopram (CELEXA) 40 MG tablet citalopram 40 mg tablet  Take 1 tablet every day by oral route.     clonazePAM (KLONOPIN) 0.5 MG tablet Take 0.25-0.5 mg by mouth 2 (two) times daily as needed.     COLLAGEN PO Take 1 tablet by mouth at bedtime.     diphenhydrAMINE (BENADRYL) 25 MG tablet Take 25 mg by mouth at bedtime as needed.     fluticasone (FLONASE) 50 MCG/ACT nasal spray Place 2 sprays into the nose daily as needed for allergies.      glucosamine-chondroitin 500-400 MG tablet Take 1 tablet by mouth 2 (two) times daily.     irbesartan (AVAPRO) 150 MG tablet irbesartan 150 mg tablet  Take 1 tablet every day by oral route.     loratadine (CLARITIN) 10 MG tablet Take 10 mg by mouth daily as needed for allergies.     Wheat Dextrin (BENEFIBER DRINK MIX PO) Take 1 scoop by mouth daily.      No current facility-administered medications for this visit.    Family History  Problem Relation Age of Onset   Hypertension Mother    Osteoporosis  Mother        after age 4   Cancer Mother        pancreatic cancer   Hypertension Father    Heart disease Father    Heart attack Father    Osteoporosis Maternal Grandmother    Cancer Maternal Grandfather        colon    Review of Systems  All other systems reviewed and are negative.  Exam:   BP 120/68    Pulse 88    Ht 5' 6.25" (1.683 m)    Wt 163 lb (73.9 kg)    LMP 09/16/2013 (Exact Date)    SpO2 98%    BMI 26.11 kg/m     General appearance: alert, cooperative and appears stated age Head: normocephalic, without obvious abnormality, atraumatic Neck: no adenopathy, supple, symmetrical, trachea midline and thyroid normal to inspection and palpation Lungs: clear to auscultation bilaterally Breasts: normal appearance, no masses or tenderness, No nipple retraction or dimpling, No nipple  discharge or bleeding, No axillary adenopathy Heart: regular rate and rhythm Abdomen: soft, non-tender; no masses, no organomegaly Extremities: extremities normal, atraumatic, no cyanosis or edema Skin: skin color, texture, turgor normal. No rashes or lesions Lymph nodes: cervical, supraclavicular, and axillary nodes normal. Neurologic: grossly normal  Pelvic: External genitalia:  no lesions              No abnormal inguinal nodes palpated.              Urethra:  normal appearing urethra with no masses, tenderness or lesions              Bartholins and Skenes: normal                 Vagina: normal appearing vagina with normal color and discharge, no lesions              Cervix: no lesions              Pap taken: yes Bimanual Exam:  Uterus:  normal size, contour, position, consistency, mobility, non-tender              Adnexa: no mass, fullness, tenderness              Rectal exam: yes.  Confirms.              Anus:  normal sphincter tone, no lesions  Chaperone was present for exam:  Estill Bamberg, CMA  Assessment:   Well woman visit with gynecologic exam. Stress incontinence.  Osteopenia.   Plan: Mammogram screening discussed. Self breast awareness reviewed. Pap and HR HPV as above. Guidelines for Calcium, Vitamin D, regular exercise program including cardiovascular and weight bearing exercise. Kegel's reviewed.  Bone density recommended.  Follow up annually and prn.   After visit summary provided.

## 2021-06-12 ENCOUNTER — Other Ambulatory Visit (HOSPITAL_COMMUNITY)
Admission: RE | Admit: 2021-06-12 | Discharge: 2021-06-12 | Disposition: A | Discharge status: Home / Self Care | Payer: Managed Care, Other (non HMO) | Source: Ambulatory Visit | Attending: Obstetrics and Gynecology | Admitting: Obstetrics and Gynecology

## 2021-06-12 ENCOUNTER — Encounter: Payer: Self-pay | Admitting: Obstetrics and Gynecology

## 2021-06-12 ENCOUNTER — Ambulatory Visit (INDEPENDENT_AMBULATORY_CARE_PROVIDER_SITE_OTHER): Payer: Managed Care, Other (non HMO) | Admitting: Obstetrics and Gynecology

## 2021-06-12 ENCOUNTER — Other Ambulatory Visit: Payer: Self-pay

## 2021-06-12 VITALS — BP 120/68 | HR 88 | Ht 66.25 in | Wt 163.0 lb

## 2021-06-12 DIAGNOSIS — Z01419 Encounter for gynecological examination (general) (routine) without abnormal findings: Secondary | ICD-10-CM

## 2021-06-12 DIAGNOSIS — Z124 Encounter for screening for malignant neoplasm of cervix: Secondary | ICD-10-CM | POA: Insufficient documentation

## 2021-06-12 NOTE — Patient Instructions (Addendum)
EXERCISE AND DIET:  We recommended that you start or continue a regular exercise program for good health. Regular exercise means any activity that makes your heart beat faster and makes you sweat.  We recommend exercising at least 30 minutes per day at least 3 days a week, preferably 4 or 5.  We also recommend a diet low in fat and sugar.  Inactivity, poor dietary choices and obesity can cause diabetes, heart attack, stroke, and kidney damage, among others.    ALCOHOL AND SMOKING:  Women should limit their alcohol intake to no more than 7 drinks/beers/glasses of wine (combined, not each!) per week. Moderation of alcohol intake to this level decreases your risk of breast cancer and liver damage. And of course, no recreational drugs are part of a healthy lifestyle.  And absolutely no smoking or even second hand smoke. Most people know smoking can cause heart and lung diseases, but did you know it also contributes to weakening of your bones? Aging of your skin?  Yellowing of your teeth and nails?  CALCIUM AND VITAMIN D:  Adequate intake of calcium and Vitamin D are recommended.  The recommendations for exact amounts of these supplements seem to change often, but generally speaking 600 mg of calcium (either carbonate or citrate) and 800 units of Vitamin D per day seems prudent. Certain women may benefit from higher intake of Vitamin D.  If you are among these women, your doctor will have told you during your visit.    PAP SMEARS:  Pap smears, to check for cervical cancer or precancers,  have traditionally been done yearly, although recent scientific advances have shown that most women can have pap smears less often.  However, every woman still should have a physical exam from her gynecologist every year. It will include a breast check, inspection of the vulva and vagina to check for abnormal growths or skin changes, a visual exam of the cervix, and then an exam to evaluate the size and shape of the uterus and  ovaries.  And after 60 years of age, a rectal exam is indicated to check for rectal cancers. We will also provide age appropriate advice regarding health maintenance, like when you should have certain vaccines, screening for sexually transmitted diseases, bone density testing, colonoscopy, mammograms, etc.   MAMMOGRAMS:  All women over 5 years old should have a yearly mammogram. Many facilities now offer a "3D" mammogram, which may cost around $50 extra out of pocket. If possible,  we recommend you accept the option to have the 3D mammogram performed.  It both reduces the number of women who will be called back for extra views which then turn out to be normal, and it is better than the routine mammogram at detecting truly abnormal areas.    COLONOSCOPY:  Colonoscopy to screen for colon cancer is recommended for all    Kegel Exercises Kegel exercises can help strengthen your pelvic floor muscles. The pelvic floor is a group of muscles that support your rectum, small intestine, and bladder. In females, pelvic floor muscles also help support the uterus. These muscles help you control the flow of urine and stool (feces). Kegel exercises are painless and simple. They do not require any equipment. Your provider may suggest Kegel exercises to: Improve bladder and bowel control. Improve sexual response. Improve weak pelvic floor muscles after surgery to remove the uterus (hysterectomy) or after pregnancy, in females. Improve weak pelvic floor muscles after prostate gland removal or surgery, in males. Kegel exercises  involve squeezing your pelvic floor muscles. These are the same muscles you squeeze when you try to stop the flow of urine or keep from passing gas. The exercises can be done while sitting, standing, or lying down, but it is best to vary your position. Ask your health care provider which exercises are safe for you. Do exercises exactly as told by your health care provider and adjust them as  directed. Do not begin these exercises until told by your health care provider. Exercises How to do Kegel exercises: Squeeze your pelvic floor muscles tight. You should feel a tight lift in your rectal area. If you are a female, you should also feel a tightness in your vaginal area. Keep your stomach, buttocks, and legs relaxed. Hold the muscles tight for up to 10 seconds. Breathe normally. Relax your muscles for up to 10 seconds. Repeat as told by your health care provider. Repeat this exercise daily as told by your health care provider. Continue to do this exercise for at least 4-6 weeks, or for as long as told by your health care provider. You may be referred to a physical therapist who can help you learn more about how to do Kegel exercises. Depending on your condition, your health care provider may recommend: Varying how long you squeeze your muscles. Doing several sets of exercises every day. Doing exercises for several weeks. Making Kegel exercises a part of your regular exercise routine. This information is not intended to replace advice given to you by your health care provider. Make sure you discuss any questions you have with your health care provider. Document Revised: 10/03/2020 Document Reviewed: 10/03/2020 Elsevier Patient Education  2022 [employer]. women at age 60.  We know, you hate the idea of the prep.  We agree, BUT, having colon cancer and not knowing it is worse!!  Colon cancer so often starts as a polyp that can be seen and removed at colonscopy, which can quite literally save your life!  And if your first colonoscopy is normal and you have no family history of colon cancer, most women don't have to have it again for 10 years.  Once every ten years, you can do something that may end up saving your life, right?  We will be happy to help you get it scheduled when you are ready.  Be sure to check your insurance coverage so you understand how much it will cost.  It may be  covered as a preventative service at no cost, but you should check your particular policy.

## 2021-06-16 LAB — CYTOLOGY - PAP
Comment: NEGATIVE
Diagnosis: NEGATIVE
High risk HPV: NEGATIVE

## 2022-06-04 NOTE — Progress Notes (Deleted)
61 y.o. [id number] Married Caucasian female here for annual exam.    PCP:     Patient's last menstrual period was 09/16/2013 (exact date).           Sexually active: {yes no:314532}  The current method of family planning is post menopausal status.    Exercising: {yes no:314532}  {types:19826} Smoker:  {YES [id number]  Health Maintenance: Pap:  06/12/21 negative: HR HPV negative, 03-13-16 Neg:Neg HR HPV  History of abnormal Pap:  no MMG:  03/13/21, Breast Density Category B, BI-RADS CATEGORY 1 Negative Colonoscopy:  04/27/14 BMD:   n/a  Result  n/a TDaP:  12/30/14 Gardasil:   no HIV: Hep C: Screening Labs:  Hb today: ***, Urine today: ***   reports that she has never smoked. She has never used smokeless tobacco. She reports current alcohol use of about 3.0 standard drinks of alcohol per week. She reports that she does not use drugs.  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)    some in left ear   PONV (postoperative nausea and vomiting)     Past Surgical History:  Procedure Laterality Date   CHOLECYSTECTOMY N/A 05/12/2013   Procedure: LAPAROSCOPIC CHOLECYSTECTOMY WITH INTRAOPERATIVE CHOLANGIOGRAM;  Surgeon: Earnstine Regal, MD;  Location: [facility];  Service: General;  Laterality: N/A;   CYSTECTOMY  1999   dermiod cyst   removal of norplant  1997   STAPEDECTOMY Right 09/2014   TONSILLECTOMY AND ADENOIDECTOMY     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     calcium carbonate (TUMS - DOSED IN MG ELEMENTAL CALCIUM) 500 MG chewable tablet Chew 1 tablet by mouth daily.     cetirizine (ZYRTEC) 10 MG tablet Take 10 mg by mouth daily.     Cholecalciferol (VITAMIN D) 2000 units CAPS Take 1 capsule by mouth daily.     citalopram (CELEXA) 40  MG tablet citalopram 40 mg tablet  Take 1 tablet every day by oral route.     clonazePAM (KLONOPIN) 0.5 MG tablet Take 0.25-0.5 mg by mouth 2 (two) times daily as needed.     COLLAGEN PO Take 1 tablet by mouth at bedtime.     diphenhydrAMINE (BENADRYL) 25 MG tablet Take 25 mg by mouth at bedtime as needed.     fluticasone (FLONASE) 50 MCG/ACT nasal spray Place 2 sprays into the nose daily as needed for allergies.      glucosamine-chondroitin 500-400 MG tablet Take 1 tablet by mouth 2 (two) times daily.     irbesartan (AVAPRO) 150 MG tablet irbesartan 150 mg tablet  Take 1 tablet every day by oral route.     loratadine (CLARITIN) 10 MG tablet Take 10 mg by mouth daily as needed for allergies.     Wheat Dextrin (BENEFIBER DRINK MIX PO) Take 1 scoop by mouth daily.      No current facility-administered medications for this visit.    Family History  Problem Relation Age of Onset   Hypertension Mother    Osteoporosis Mother        after age 55   Cancer Mother        pancreatic cancer   Hypertension Father  Heart disease Father    Heart attack Father    Osteoporosis Maternal Grandmother    Cancer Maternal Grandfather        colon    Review of Systems  Exam:   LMP 09/16/2013 (Exact Date)     General appearance: alert, cooperative and appears stated age Head: normocephalic, without obvious abnormality, atraumatic Neck: no adenopathy, supple, symmetrical, trachea midline and thyroid normal to inspection and palpation Lungs: clear to auscultation bilaterally Breasts: normal appearance, no masses or tenderness, No nipple retraction or dimpling, No nipple discharge or bleeding, No axillary adenopathy Heart: regular rate and rhythm Abdomen: soft, non-tender; no masses, no organomegaly Extremities: extremities normal, atraumatic, no cyanosis or edema Skin: skin color, texture, turgor normal. No rashes or lesions Lymph nodes: cervical, supraclavicular, and axillary nodes  normal. Neurologic: grossly normal  Pelvic: External genitalia:  no lesions              No abnormal inguinal nodes palpated.              Urethra:  normal appearing urethra with no masses, tenderness or lesions              Bartholins and Skenes: normal                 Vagina: normal appearing vagina with normal color and discharge, no lesions              Cervix: no lesions              Pap taken: {yes no:314532} Bimanual Exam:  Uterus:  normal size, contour, position, consistency, mobility, non-tender              Adnexa: no mass, fullness, tenderness              Rectal exam: {yes no:314532}.  Confirms.              Anus:  normal sphincter tone, no lesions  Chaperone was present for exam:  ***  Assessment:   Well woman visit with gynecologic exam.   Plan: Mammogram screening discussed. Self breast awareness reviewed. Pap and HR HPV as above. Guidelines for Calcium, Vitamin D, regular exercise program including cardiovascular and weight bearing exercise.   Follow up annually and prn.   Additional counseling given.  {yes [id number]. _______ minutes face to face time of which over 50% was spent in counseling.    After visit summary provided.

## 2022-06-16 ENCOUNTER — Ambulatory Visit: Payer: Managed Care, Other (non HMO) | Admitting: Obstetrics and Gynecology

## 2022-06-26 NOTE — Progress Notes (Signed)
62 y.o. [id number] Married Caucasian female here for annual exam.    Will do a coronary calcium test.   May want to restart yoga.   PCP:   Dr. Brigitte Pulse  Patient's last menstrual period was 09/16/2013 (exact date).           Sexually active: No.  The current method of family planning is post menopausal status/vasectomy.    Exercising: Yes.     Walking. Smoker:  no  Health Maintenance: Pap:  06/12/21 neg: HR HPV neg,  03-13-16 Neg:Neg HR HPV, 01-06-13 Neg:Neg HR HPV   History of abnormal Pap:  yes, 25 years ago, repeat was normal MMG: Pt reported done 2023 and everything was normal, 03/13/21 Breast Density Category B, BI-RADS CATEGORY 1 Neg Colonoscopy:  04/27/14 BMD:   PCP office borderline osteopenia per patient.  Due this year per patient.  TDaP:  12/30/14 Gardasil:   no HIV: donated blood Hep C: donated blood Screening Labs:  PCP Flu and Covid vaccine:  completed.  RSV and Shingles vaccine discussed.    reports that she has never smoked. She has never used smokeless tobacco. She reports current alcohol use of about 3.0 standard drinks of alcohol per week. She reports that she does not use drugs.  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)    some in left ear   PONV (postoperative nausea and vomiting)     Past Surgical History:  Procedure Laterality Date   CHOLECYSTECTOMY N/A 05/12/2013   Procedure: LAPAROSCOPIC CHOLECYSTECTOMY WITH INTRAOPERATIVE CHOLANGIOGRAM;  Surgeon: Earnstine Regal, MD;  Location: [facility];  Service: General;  Laterality: N/A;   CYSTECTOMY  1999   dermiod cyst   removal of norplant  1997   STAPEDECTOMY Right 09/2014   TONSILLECTOMY AND ADENOIDECTOMY     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     Cholecalciferol (VITAMIN D) 2000 units CAPS Take 1 capsule by mouth daily.     citalopram (CELEXA) 40 MG tablet citalopram 40 mg tablet  Take 1 tablet every day by oral route.     clonazePAM (KLONOPIN) 0.5 MG tablet Take 0.25-0.5 mg by mouth 2 (two) times daily as needed.     COLLAGEN PO Take 1 tablet by mouth at bedtime.     fluticasone (FLONASE) 50 MCG/ACT nasal spray Place 2 sprays into the nose daily as needed for allergies.      glucosamine-chondroitin 500-400 MG tablet Take 1 tablet by mouth 2 (two) times daily.     irbesartan (AVAPRO) 150 MG tablet irbesartan 150 mg tablet  Take 1 tablet every day by oral route.     loratadine (CLARITIN) 10 MG tablet Take 10 mg by mouth daily as needed for allergies.     Wheat Dextrin (BENEFIBER DRINK MIX PO) Take 1 scoop by mouth daily.      No current facility-administered medications for this visit.    Family History  Problem Relation Age of Onset   Hypertension Mother    Osteoporosis Mother        after age 3   Cancer Mother        pancreatic cancer  Hypertension Father    Heart disease Father    Heart attack Father    Osteoporosis Maternal Grandmother    Cancer Maternal Grandfather        colon    Review of Systems  All other systems reviewed and are negative.   Exam:   BP 122/86 (BP Location: Right Arm, Patient Position: Sitting, Cuff Size: Normal)   Pulse 75   Ht 5' 6.5" (1.689 m)   Wt 167 lb (75.8 kg)   LMP 09/16/2013 (Exact Date)   SpO2 96%   BMI 26.55 kg/m     General appearance: alert, cooperative and appears stated age Head: normocephalic, without obvious abnormality, atraumatic Neck: no adenopathy, supple, symmetrical, trachea midline and thyroid normal to inspection and palpation Lungs: clear to auscultation bilaterally Breasts: normal appearance, no masses or tenderness, No nipple retraction or dimpling, No nipple discharge or bleeding, No axillary adenopathy Heart: regular rate and  rhythm Abdomen: soft, non-tender; no masses, no organomegaly Extremities: extremities normal, atraumatic, no cyanosis or edema Skin: skin color, texture, turgor normal. No rashes or lesions Lymph nodes: cervical, supraclavicular, and axillary nodes normal. Neurologic: grossly normal  Pelvic: External genitalia:  no lesions              No abnormal inguinal nodes palpated.              Urethra:  normal appearing urethra with no masses, tenderness or lesions              Bartholins and Skenes: normal                 Vagina: normal appearing vagina with normal color and discharge, no lesions              Cervix: no lesions              Pap taken: no Bimanual Exam:  Uterus:  normal size, contour, position, consistency, mobility, non-tender              Adnexa: no mass, fullness, tenderness              Rectal exam: yes.  Confirms.              Anus:  normal sphincter tone, no lesions  Chaperone was present for exam:  Emily  Assessment:   Well woman visit with gynecologic exam. Hx stress incontinence.  Osteopenia.  PCP following. FH pancreatic cancer.   Plan: Mammogram screening discussed.  Will get a copy of her mammogram [facility].  Self breast awareness reviewed. Pap and HR HPV 2028. Guidelines for Calcium, Vitamin D, regular exercise program including cardiovascular and weight bearing exercise. Referral for genetics counseling and testing.  Follow up annually and prn.   After visit summary provided.

## 2022-07-06 ENCOUNTER — Other Ambulatory Visit: Payer: Self-pay | Admitting: Internal Medicine

## 2022-07-06 DIAGNOSIS — E785 Hyperlipidemia, unspecified: Secondary | ICD-10-CM

## 2022-07-09 ENCOUNTER — Ambulatory Visit (INDEPENDENT_AMBULATORY_CARE_PROVIDER_SITE_OTHER): Payer: Managed Care, Other (non HMO) | Admitting: Obstetrics and Gynecology

## 2022-07-09 ENCOUNTER — Encounter: Payer: Self-pay | Admitting: Obstetrics and Gynecology

## 2022-07-09 VITALS — BP 122/86 | HR 75 | Ht 66.5 in | Wt 167.0 lb

## 2022-07-09 DIAGNOSIS — Z01419 Encounter for gynecological examination (general) (routine) without abnormal findings: Secondary | ICD-10-CM

## 2022-07-09 DIAGNOSIS — Z800 Family history of malignant neoplasm of digestive organs: Secondary | ICD-10-CM

## 2022-07-09 NOTE — Patient Instructions (Signed)

## 2022-07-15 ENCOUNTER — Telehealth: Payer: Self-pay | Admitting: Licensed Clinical Social Worker

## 2022-07-15 ENCOUNTER — Encounter: Payer: Self-pay | Admitting: Obstetrics and Gynecology

## 2022-07-15 NOTE — Telephone Encounter (Signed)
Scheduled appt per 2/1 referral. Pt is aware of appt date and time. Pt is aware to arrive 15 mins prior to appt time and to bring and updated insurance card. Pt is aware of appt location.

## 2022-07-31 ENCOUNTER — Other Ambulatory Visit: Payer: Managed Care, Other (non HMO)

## 2022-07-31 ENCOUNTER — Ambulatory Visit
Admission: RE | Admit: 2022-07-31 | Discharge: 2022-07-31 | Disposition: A | Discharge status: Home / Self Care | Payer: No Typology Code available for payment source | Source: Ambulatory Visit | Attending: Internal Medicine | Admitting: Internal Medicine

## 2022-07-31 DIAGNOSIS — E785 Hyperlipidemia, unspecified: Secondary | ICD-10-CM

## 2022-08-06 ENCOUNTER — Other Ambulatory Visit: Payer: Managed Care, Other (non HMO)

## 2022-09-07 ENCOUNTER — Encounter: Payer: Self-pay | Admitting: Licensed Clinical Social Worker

## 2022-09-07 ENCOUNTER — Inpatient Hospital Stay: Payer: Managed Care, Other (non HMO) | Admitting: Licensed Clinical Social Worker

## 2022-09-07 ENCOUNTER — Other Ambulatory Visit: Payer: Self-pay

## 2022-09-07 ENCOUNTER — Inpatient Hospital Stay: Payer: Managed Care, Other (non HMO)

## 2022-09-07 DIAGNOSIS — Z800 Family history of malignant neoplasm of digestive organs: Secondary | ICD-10-CM | POA: Diagnosis not present

## 2022-09-07 LAB — GENETIC SCREENING ORDER

## 2022-09-07 NOTE — Progress Notes (Signed)
REFERRING PROVIDER: Nunzio Cobbs, MD [address] [place],  [place] [address]  PRIMARY PROVIDER:  Ginger Organ., MD  PRIMARY REASON FOR VISIT:  1. Family history of pancreatic cancer   2. Family history of colon cancer      HISTORY OF PRESENT ILLNESS:   Ms. Ariel Nicholson, a 62 y.o. female, was seen for a [place] cancer genetics consultation at the request of Dr. Yisroel Ramming due to a family history of cancer.  Ariel Nicholson presents to clinic today to discuss the possibility of a hereditary predisposition to cancer, genetic testing, and to further clarify her future cancer risks, as well as potential cancer risks for family members.   CANCER HISTORY:  Ariel Nicholson is a 62 y.o. female with no personal history of cancer.    RISK FACTORS:  Menarche was at age 70.  First live birth at age 89.  Ovaries intact: yes.  Hysterectomy: no.  Menopausal status: postmenopausal.  Colonoscopy: yes; pt reports 2018, normal. Mammogram within the last year: yes. Up to date with pelvic exams: yes.  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  Significant diagnoses are listed below: Family History  Problem Relation Age of Onset   Hypertension Mother    Osteoporosis Mother         after age 43   Cancer Mother        pancreatic cancer   Hypertension Father    Heart disease Father    Heart attack Father    Osteoporosis Maternal Grandmother    Cancer Maternal Grandfather        colon   Ariel Nicholson has 2 daughters, 1 brother, 1 sister, no cancers.  Ariel Nicholson mother died of pancreatic cancer at 50. Patient's maternal grandfather died of colon cancer at 51.  Ariel Nicholson father died at 68 of heart issues. Paternal grandmother had breast cancer at 72 and died of a stroke at 35.  Ariel Nicholson is unaware of previous family history of genetic testing for hereditary cancer risks. There is no reported Ashkenazi Jewish ancestry. There is no known consanguinity.    GENETIC COUNSELING ASSESSMENT: Ariel Nicholson is a 62 y.o. female with a family history of pancreatic cancer which is somewhat suggestive of a hereditary cancer syndrome and predisposition to cancer. We, therefore, discussed and recommended the following at today's visit.   DISCUSSION: We discussed that approximately 10% of pancreatic cancer is hereditary. Most cases of hereditary pancreatic cancer are associated with BRCA1/BRCA2 genes, although there are other genes associated with hereditary cancer as well. Cancers and risks are gene specific. We discussed that testing is beneficial for several reasons including knowing about cancer risks, identifying potential screening and  risk-reduction options that may be appropriate, and to understand if other family members could be at risk for cancer and allow them to undergo genetic testing.   We reviewed the characteristics, features and inheritance patterns of hereditary cancer syndromes. We also discussed genetic testing, including the appropriate family members to test, the process of testing, insurance coverage and turn-around-time for results. We discussed the implications of a negative, positive and/or variant of uncertain significant  result. We recommended Ariel Nicholson pursue genetic testing for the [facility] CancerNext-Expanded+RNA gene panel.   Based on Ariel Nicholson family history of cancer, she meets medical criteria for genetic testing. Despite that she meets criteria, she may still have an out of pocket cost. We discussed that if her out of pocket cost for testing is over $100, the laboratory will call and confirm whether she wants to proceed with testing.  If the out of pocket cost of testing is less than $100 she will be billed by the genetic testing laboratory.   PLAN: After considering the risks, benefits, and limitations, [name] provided informed consent to pursue genetic testing and the blood sample was sent to [facility] for analysis of the CancerNext-Expanded+RNA panel. Results should be available within approximately 2-3 weeks' time, at which point they will be disclosed by telephone to Ariel Nicholson, as will any additional recommendations warranted by these results. Ariel Nicholson will receive a summary of her genetic counseling visit and a copy of her results once available. This information will also be available in Epic.   Ariel Nicholson questions were answered to her satisfaction today. Our contact information was provided should additional questions or concerns arise. Thank you for the referral and allowing [name] to share in the care of your patient.   Faith Rogue, MS, [facility] Genetic Counselor [email or web address] Phone: [phone]  The patient was seen for a total of 20 minutes in face-to-face genetic counseling.  Dr. Grayland Ormond was available for discussion regarding this case.   _______________________________________________________________________ For Office Staff:  Number of people involved in session: 1 Was an Intern/ student involved with case: no

## 2022-09-17 ENCOUNTER — Ambulatory Visit: Payer: Self-pay | Admitting: Licensed Clinical Social Worker

## 2022-09-17 ENCOUNTER — Encounter: Payer: Self-pay | Admitting: Licensed Clinical Social Worker

## 2022-09-17 ENCOUNTER — Telehealth: Payer: Self-pay | Admitting: Licensed Clinical Social Worker

## 2022-09-17 DIAGNOSIS — Z1379 Encounter for other screening for genetic and chromosomal anomalies: Secondary | ICD-10-CM | POA: Insufficient documentation

## 2022-09-17 NOTE — Telephone Encounter (Signed)
I contacted [name] to discuss her genetic testing results. No pathogenic variants were identified in the 71 genes analyzed. Detailed clinic note to follow.   The test report has been scanned into EPIC and is located under the Molecular Pathology section of the Results Review tab.  A portion of the result report is included below for reference.      Ariel Duverney, MS, [facility] Genetic Counselor [name] Phone: [phone]

## 2022-09-17 NOTE — Progress Notes (Signed)
HPI:   Ariel Nicholson was previously seen in the [facility] due to a family history of cancer and concerns regarding a hereditary predisposition to cancer. Please refer to our prior cancer genetics clinic note for more information regarding our discussion, assessment and recommendations, at the time. Ariel Nicholson recent genetic test results were disclosed to her, as were recommendations warranted by these results. These results and recommendations are discussed in more detail below.  CANCER HISTORY:  Oncology History   No history exists.    FAMILY HISTORY:  We obtained a detailed, 4-generation family history.  Significant diagnoses are listed below: Family History  Problem Relation Age of Onset   Hypertension Mother    Osteoporosis Mother        after age 38   Pancreatic cancer Mother        d. 11   Hypertension Father    Heart disease Father    Heart attack Father    Osteoporosis Maternal Grandmother    Colon cancer Maternal Grandfather    Ariel Nicholson has 2 daughters, 1 brother, 1 sister, no cancers.   Ariel Nicholson mother died of pancreatic cancer at 51. Patient's maternal grandfather died of colon cancer at 77.   Ariel Nicholson father died at 65 of heart issues. Paternal grandmother had breast cancer at 27 and died of a stroke at 76.   Ariel Nicholson is unaware of previous family history of genetic testing for hereditary cancer risks. There is no reported Ashkenazi Jewish ancestry. There is no known consanguinity.      GENETIC TEST RESULTS:  The [facility] CancerNext-Expanded+RNA Panel found no pathogenic mutations.   The CancerNext-Expanded + RNAinsight gene panel offered by [employer] and includes sequencing and rearrangement analysis for the following 71 genes: AIP, ALK, APC*, ATM*, AXIN2, BAP1, BARD1, BMPR1A, BRCA1*, BRCA2*, BRIP1*, CDC73, CDH1*,CDK4, CDKN1B, CDKN2A, CHEK2*, CTNNA1, DICER1, FH, FLCN, KIF1B, LZTR1, MAX,  MEN1, MET, MLH1*, MSH2*, MSH3, MSH6*, MUTYH*, NF1*, NF2, NTHL1, PALB2*, PHOX2B, PMS2*, POT1, PRKAR1A, PTCH1, PTEN*, RAD51C*, RAD51D*,RB1, RET, SDHA, SDHAF2, SDHB, SDHC, SDHD, SMAD4, SMARCA4, SMARCB1, SMARCE1, STK11, SUFU, TMEM127, TP53*,TSC1, TSC2, VHL; EGFR, EGLN1, HOXB13, KIT, MITF, PDGFRA, POLD1 and POLE (sequencing only); EPCAM and GREM1 (deletion/duplication only).  The test report has been scanned into EPIC and is located under the Molecular Pathology section of the Results Review tab.  A portion of the result report is included below for reference. Genetic testing reported out on 09/15/2022.     Even though a pathogenic variant was not identified, possible explanations for the cancer in the family may include: There may be no hereditary risk for cancer in the family. The cancers in Ariel Nicholson and/or her family may be sporadic/familial or due to other genetic and environmental factors. There may be a gene mutation in one of these genes that current testing methods cannot detect but that chance is small. There could be another gene that has not yet been discovered, or that we have not yet tested, that is responsible for the cancer diagnoses in the family.  It is also possible there is a hereditary cause for the cancer in the family that Ariel Nicholson did not inherit.  Therefore, it is important to remain in touch with cancer genetics in the future so that we can continue to offer Ariel Nicholson the most up to date genetic testing.   ADDITIONAL GENETIC TESTING:  We discussed with Ariel Nicholson that her genetic testing was fairly extensive.  If there are additional relevant  genes identified to increase cancer risk that can be analyzed in the future, we would be happy to discuss and coordinate this testing at that time.    CANCER SCREENING RECOMMENDATIONS:  Ariel Nicholson test result is considered negative (normal).  This means that we have not identified a hereditary cause  for her family history of cancer at this time.   An individual's cancer risk and medical management are not determined by genetic test results alone. Overall cancer risk assessment incorporates additional factors, including personal medical history, family history, and any available genetic information that may result in a personalized plan for cancer prevention and surveillance. Therefore, it is recommended she continue to follow the cancer management and screening guidelines provided by herprimary healthcare provider.  RECOMMENDATIONS FOR FAMILY MEMBERS:   Since she did not inherit a identifiable mutation in a cancer predisposition gene included on this panel, her children could not have inherited a known mutation from her in one of these genes. Individuals in this family might be at some increased risk of developing cancer, over the general population risk, due to the family history of cancer.  Individuals in the family should notify their providers of the family history of cancer. We recommend women in this family have a yearly mammogram beginning at age 58, or 51 years younger than the earliest onset of cancer, an annual clinical breast exam, and perform monthly breast self-exams.  Family members should have colonoscopies by at age 58, or earlier, as recommended by their providers. Other members of the family may still carry a pathogenic variant in one of these genes that Ariel Nicholson did not inherit. Based on the family history, we recommend her maternal relatives have genetic counseling and testing. Ariel Nicholson will let us know if we can be of any assistance in coordinating genetic counseling and/or testing for this family member.   FOLLOW-UP:  Lastly, we discussed with Ariel Nicholson that cancer genetics is a rapidly advancing field and it is possible that new genetic tests will be appropriate for her and/or her family members in the future. We encouraged her to remain in contact with  cancer genetics on an annual basis so we can update her personal and family histories and let her know of advances in cancer genetics that may benefit this family.   Our contact number was provided. Ariel Nicholson questions were answered to her satisfaction, and she knows she is welcome to call us at anytime with additional questions or concerns.    Lacy Duverney, MS, [facility] Genetic Counselor [email or web address] Phone: [phone]

## 2022-10-27 ENCOUNTER — Ambulatory Visit (HOSPITAL_COMMUNITY)
Admission: RE | Admit: 2022-10-27 | Discharge: 2022-10-27 | Disposition: A | Discharge status: Home / Self Care | Payer: Managed Care, Other (non HMO) | Source: Ambulatory Visit | Attending: Internal Medicine | Admitting: Internal Medicine

## 2022-10-27 ENCOUNTER — Encounter (HOSPITAL_COMMUNITY): Payer: Self-pay

## 2022-10-27 ENCOUNTER — Other Ambulatory Visit: Payer: Self-pay

## 2022-10-27 ENCOUNTER — Other Ambulatory Visit: Payer: Self-pay | Admitting: Internal Medicine

## 2022-10-27 ENCOUNTER — Inpatient Hospital Stay (HOSPITAL_COMMUNITY)
Admission: EM | Admit: 2022-10-27 | Discharge: 2022-10-30 | DRG: 392 | Disposition: A | Discharge status: Home / Self Care | Payer: Managed Care, Other (non HMO) | Attending: Internal Medicine | Admitting: Internal Medicine

## 2022-10-27 DIAGNOSIS — F419 Anxiety disorder, unspecified: Secondary | ICD-10-CM | POA: Diagnosis not present

## 2022-10-27 DIAGNOSIS — E785 Hyperlipidemia, unspecified: Secondary | ICD-10-CM

## 2022-10-27 DIAGNOSIS — H9193 Unspecified hearing loss, bilateral: Secondary | ICD-10-CM | POA: Diagnosis present

## 2022-10-27 DIAGNOSIS — K5900 Constipation, unspecified: Secondary | ICD-10-CM | POA: Diagnosis present

## 2022-10-27 DIAGNOSIS — Z888 Allergy status to other drugs, medicaments and biological substances status: Secondary | ICD-10-CM

## 2022-10-27 DIAGNOSIS — I159 Secondary hypertension, unspecified: Secondary | ICD-10-CM

## 2022-10-27 DIAGNOSIS — Z882 Allergy status to sulfonamides status: Secondary | ICD-10-CM | POA: Diagnosis not present

## 2022-10-27 DIAGNOSIS — E876 Hypokalemia: Secondary | ICD-10-CM | POA: Diagnosis present

## 2022-10-27 DIAGNOSIS — Z8249 Family history of ischemic heart disease and other diseases of the circulatory system: Secondary | ICD-10-CM

## 2022-10-27 DIAGNOSIS — Z8262 Family history of osteoporosis: Secondary | ICD-10-CM | POA: Diagnosis not present

## 2022-10-27 DIAGNOSIS — K5720 Diverticulitis of large intestine with perforation and abscess without bleeding: Principal | ICD-10-CM | POA: Diagnosis present

## 2022-10-27 DIAGNOSIS — K5792 Diverticulitis of intestine, part unspecified, without perforation or abscess without bleeding: Secondary | ICD-10-CM | POA: Diagnosis present

## 2022-10-27 DIAGNOSIS — R1030 Lower abdominal pain, unspecified: Secondary | ICD-10-CM

## 2022-10-27 DIAGNOSIS — I10 Essential (primary) hypertension: Secondary | ICD-10-CM | POA: Diagnosis present

## 2022-10-27 DIAGNOSIS — Z79899 Other long term (current) drug therapy: Secondary | ICD-10-CM | POA: Diagnosis not present

## 2022-10-27 DIAGNOSIS — Z800 Family history of malignant neoplasm of digestive organs: Secondary | ICD-10-CM | POA: Diagnosis not present

## 2022-10-27 DIAGNOSIS — Z8619 Personal history of other infectious and parasitic diseases: Secondary | ICD-10-CM

## 2022-10-27 LAB — URINALYSIS, ROUTINE W REFLEX MICROSCOPIC
Bilirubin Urine: NEGATIVE
Glucose, UA: NEGATIVE mg/dL
Hgb urine dipstick: NEGATIVE
Ketones, ur: NEGATIVE mg/dL
Leukocytes,Ua: NEGATIVE
Nitrite: NEGATIVE
Protein, ur: NEGATIVE mg/dL
Specific Gravity, Urine: 1.034 — ABNORMAL HIGH (ref 1.005–1.030)
pH: 6 (ref 5.0–8.0)

## 2022-10-27 LAB — COMPREHENSIVE METABOLIC PANEL
ALT: 16 U/L (ref 0–44)
AST: 19 U/L (ref 15–41)
Albumin: 3.8 g/dL (ref 3.5–5.0)
Alkaline Phosphatase: 31 U/L — ABNORMAL LOW (ref 38–126)
Anion gap: 8 (ref 5–15)
BUN: 11 mg/dL (ref 8–23)
CO2: 24 mmol/L (ref 22–32)
Calcium: 8.8 mg/dL — ABNORMAL LOW (ref 8.9–10.3)
Chloride: 104 mmol/L (ref 98–111)
Creatinine, Ser: 0.66 mg/dL (ref 0.44–1.00)
GFR, Estimated: >60 mL/min (ref >60)
Glucose, Bld: 85 mg/dL (ref 70–99)
Potassium: 3.4 mmol/L — ABNORMAL LOW (ref 3.5–5.1)
Sodium: 136 mmol/L (ref 135–145)
Total Bilirubin: 1.2 mg/dL (ref 0.3–1.2)
Total Protein: 6.7 g/dL (ref 6.5–8.1)

## 2022-10-27 LAB — CBC WITH DIFFERENTIAL/PLATELET
Abs Immature Granulocytes: 0.02 10*3/uL (ref 0.00–0.07)
Basophils Absolute: 0 10*3/uL (ref 0.0–0.1)
Basophils Relative: 0 %
Eosinophils Absolute: 0.1 10*3/uL (ref 0.0–0.5)
Eosinophils Relative: 1 %
HCT: 36.1 % (ref 36.0–46.0)
Hemoglobin: 11.9 g/dL — ABNORMAL LOW (ref 12.0–15.0)
Immature Granulocytes: 0 %
Lymphocytes Relative: 21 %
Lymphs Abs: 1.1 10*3/uL (ref 0.7–4.0)
MCH: 30.1 pg (ref 26.0–34.0)
MCHC: 33 g/dL (ref 30.0–36.0)
MCV: 91.2 fL (ref 80.0–100.0)
Monocytes Absolute: 0.5 10*3/uL (ref 0.1–1.0)
Monocytes Relative: 10 %
Neutro Abs: 3.4 10*3/uL (ref 1.7–7.7)
Neutrophils Relative %: 68 %
Platelets: 159 10*3/uL (ref 150–400)
RBC: 3.96 MIL/uL (ref 3.87–5.11)
RDW: 12.9 % (ref 11.5–15.5)
WBC: 5 10*3/uL (ref 4.0–10.5)
nRBC: 0 % (ref 0.0–0.2)

## 2022-10-27 LAB — LACTIC ACID, PLASMA: Lactic Acid, Venous: 0.6 mmol/L (ref 0.5–1.9)

## 2022-10-27 MED ORDER — PIPERACILLIN-TAZOBACTAM 3.375 G IVPB
3.3750 g | Freq: Three times a day (TID) | INTRAVENOUS | Status: DC
  Administered 2022-10-28 – 2022-10-30 (×8): 3.375 g via INTRAVENOUS
  Filled 2022-10-27 (×8): qty 50

## 2022-10-27 MED ORDER — ACETAMINOPHEN 325 MG PO TABS: 650.0000 mg | ORAL_TABLET | Freq: Four times a day (QID) | ORAL | Status: DC | PRN

## 2022-10-27 MED ORDER — MORPHINE SULFATE (PF) 2 MG/ML IV SOLN: 2.0000 mg | INTRAVENOUS | Status: DC | PRN

## 2022-10-27 MED ORDER — ONDANSETRON HCL 4 MG/2ML IJ SOLN: 4.0000 mg | Freq: Four times a day (QID) | INTRAMUSCULAR | Status: DC | PRN

## 2022-10-27 MED ORDER — ACETAMINOPHEN 650 MG RE SUPP: 650.0000 mg | Freq: Four times a day (QID) | RECTAL | Status: DC | PRN

## 2022-10-27 MED ORDER — METRONIDAZOLE 500 MG/100ML IV SOLN
500.0000 mg | Freq: Two times a day (BID) | INTRAVENOUS | Status: DC
  Administered 2022-10-27: 500 mg via INTRAVENOUS
  Filled 2022-10-27: qty 100

## 2022-10-27 MED ORDER — PIPERACILLIN-TAZOBACTAM 3.375 G IVPB 30 MIN
3.3750 g | Freq: Once | INTRAVENOUS | Status: AC
  Administered 2022-10-27: 3.375 g via INTRAVENOUS
  Filled 2022-10-27: qty 50

## 2022-10-27 MED ORDER — ENOXAPARIN SODIUM 40 MG/0.4ML IJ SOSY
PREFILLED_SYRINGE | INTRAMUSCULAR | Status: AC
  Filled 2022-10-27: qty 0.4

## 2022-10-27 MED ORDER — OXYCODONE HCL 5 MG PO TABS: 5.0000 mg | ORAL_TABLET | Freq: Four times a day (QID) | ORAL | Status: DC | PRN

## 2022-10-27 MED ORDER — ENOXAPARIN SODIUM 40 MG/0.4ML IJ SOSY
40.0000 mg | PREFILLED_SYRINGE | INTRAMUSCULAR | Status: DC
  Administered 2022-10-27: 40 mg via SUBCUTANEOUS
  Filled 2022-10-27 (×2): qty 0.4

## 2022-10-27 MED ORDER — SENNOSIDES-DOCUSATE SODIUM 8.6-50 MG PO TABS: 1.0000 | ORAL_TABLET | Freq: Every evening | ORAL | Status: DC | PRN

## 2022-10-27 MED ORDER — IOHEXOL 300 MG/ML  SOLN
100.0000 mL | Freq: Once | INTRAMUSCULAR | Status: AC | PRN
  Administered 2022-10-27: 100 mL via INTRAVENOUS

## 2022-10-27 MED ORDER — ONDANSETRON HCL 4 MG PO TABS: 4.0000 mg | ORAL_TABLET | Freq: Four times a day (QID) | ORAL | Status: DC | PRN

## 2022-10-27 NOTE — ED Notes (Signed)
Per patient request hold lab draw until speaking with a provider as she had labs drawn today.

## 2022-10-27 NOTE — ED Notes (Signed)
ED TO INPATIENT HANDOFF REPORT  ED Nurse Name and Phone #: Jordane Hisle   S Name/Age/Gender Ariel Nicholson 62 y.o. female Room/Bed: WA18/WA18  Code Status   Code Status: Full Code  Home/SNF/Other Home Patient oriented to: self, place, time, and situation Is this baseline? Yes   Triage Complete: Triage complete  Chief Complaint Acute diverticulitis [K57.92]  Triage Note C/o abd pain to RLQ radiating into suprapubic with tenderness x 6days, pain worsened 2 days ago.  Pain worse with movement and better after having BM Also c/o bloating, gas, and decreased appetite.    Allergies Allergies  Allergen Reactions   Sulfamethoxazole-Trimethoprim Hives and Other (See Comments)   Sulfa Antibiotics Other (See Comments)   Cephalexin Hives and Other (See Comments)    Level of Care/Admitting Diagnosis ED Disposition     ED Disposition  Admit   Condition  --   Comment  Hospital [facility] [[id number]]  Level of Care: Med-Surg [16]  May admit patient to [facility] or [facility] if equivalent level of care is available:: No  Covid Evaluation: Confirmed COVID Negative  Diagnosis: Acute diverticulitis [[id number]]  Admitting Physician: Gery Pray [[id number]]  Attending Physician: Gery Pray [[id number]]  Certification:: I certify this patient will need inpatient services for at least 2 midnights  Estimated Length of Stay: 2          B Medical/Surgery History 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)    some in left ear   PONV (postoperative nausea and vomiting)    Past Surgical History:  Procedure Laterality Date   CHOLECYSTECTOMY N/A 05/12/2013   Procedure: LAPAROSCOPIC CHOLECYSTECTOMY WITH INTRAOPERATIVE CHOLANGIOGRAM;  Surgeon: Velora Heckler, MD;  Location: [facility];  Service: General;  Laterality: N/A;   CYSTECTOMY  1999   dermiod  cyst   removal of norplant  1997   STAPEDECTOMY Right 09/2014   TONSILLECTOMY AND ADENOIDECTOMY     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     A IV Location/Drains/Wounds Patient Lines/Drains/Airways Status     Active Line/Drains/Airways     Name Placement date Placement time Site Days   Peripheral IV 10/27/22 20 G Right Forearm 10/27/22  1952  Forearm  less than 1            Intake/Output Last 24 hours  Intake/Output Summary (Last 24 hours) at 10/27/2022 2150 Last data filed at 10/27/2022 2150 Gross per 24 hour  Intake 100 ml  Output --  Net 100 ml    Labs/Imaging Results for orders placed or performed during the hospital encounter of 10/27/22 (from the past 48 hour(s))  Comprehensive metabolic panel     Status: Abnormal   Collection Time: 10/27/22  7:52 PM  Result Value Ref Range   Sodium 136 135 - 145 mmol/L   Potassium 3.4 (L) 3.5 - 5.1 mmol/L   Chloride 104 98 - 111 mmol/L   CO2 24 22 - 32 mmol/L   Glucose, Bld 85 70 - 99 mg/dL    Comment: Glucose reference range applies only to samples taken after fasting for at least 8 hours.   BUN 11 8 - 23 mg/dL   Creatinine, Ser 4.54 0.44 - 1.00 mg/dL   Calcium 8.8 (L) 8.9 - 10.3 mg/dL   Total Protein 6.7 6.5 - 8.1 g/dL   Albumin  3.8 3.5 - 5.0 g/dL   AST 19 15 - 41 U/L   ALT 16 0 - 44 U/L   Alkaline Phosphatase 31 (L) 38 - 126 U/L   Total Bilirubin 1.2 0.3 - 1.2 mg/dL   GFR, Estimated >16 >10 mL/min    Comment: (NOTE) Calculated using the CKD-EPI Creatinine Equation (2021)    Anion gap 8 5 - 15    Comment: Performed at [facility], [address], [place], [place] [address]  CBC with Differential     Status: Abnormal   Collection Time: 10/27/22  7:52 PM  Result Value Ref Range   WBC 5.0 4.0 - 10.5 K/uL   RBC 3.96 3.87 - 5.11 MIL/uL   Hemoglobin 11.9 (L) 12.0 - 15.0 g/dL   HCT 40.9 81.1 - 91.4 %   MCV 91.2 80.0 - 100.0 fL   MCH 30.1 26.0 - 34.0 pg   MCHC 33.0 30.0 - 36.0 g/dL   RDW 78.2 95.6  - 21.3 %   Platelets 159 150 - 400 K/uL   nRBC 0.0 0.0 - 0.2 %   Neutrophils Relative % 68 %   Neutro Abs 3.4 1.7 - 7.7 K/uL   Lymphocytes Relative 21 %   Lymphs Abs 1.1 0.7 - 4.0 K/uL   Monocytes Relative 10 %   Monocytes Absolute 0.5 0.1 - 1.0 K/uL   Eosinophils Relative 1 %   Eosinophils Absolute 0.1 0.0 - 0.5 K/uL   Basophils Relative 0 %   Basophils Absolute 0.0 0.0 - 0.1 K/uL   Immature Granulocytes 0 %   Abs Immature Granulocytes 0.02 0.00 - 0.07 K/uL    Comment: Performed at [facility], [address], [place], [place] [address]  Lactic acid, plasma     Status: None   Collection Time: 10/27/22  8:37 PM  Result Value Ref Range   Lactic Acid, Venous 0.6 0.5 - 1.9 mmol/L    Comment: Performed at [facility], [address], [place], [place] [address]   CT ABDOMEN PELVIS W CONTRAST  Result Date: 10/27/2022 CLINICAL DATA:  Lower abdominal pain. EXAM: CT ABDOMEN AND PELVIS WITH CONTRAST TECHNIQUE: Multidetector CT imaging of the abdomen and pelvis was performed using the standard protocol following bolus administration of intravenous contrast. RADIATION DOSE REDUCTION: This exam was performed according to the departmental dose-optimization program which includes automated exposure control, adjustment of the mA and/or kV according to patient size and/or use of iterative reconstruction technique. CONTRAST:  OMNIPAQUE IOHEXOL 300 MG/ML  SOLN COMPARISON:  None Available. FINDINGS: Lower chest: The lung bases are clear of acute process. No pleural effusion or pulmonary lesions. The heart is normal in size. No pericardial effusion. The distal esophagus and aorta are unremarkable. Hepatobiliary: No focal hepatic lesions or intrahepatic biliary dilatation. The gallbladder is normal. No common bile duct dilatation. Pancreas: Normal Spleen: Within normal limits in size.  No splenic lesions. Adrenals/Urinary Tract: Adrenal glands and kidneys are. There is  a small fatty lesion projecting off the medial aspect of the lower pole region of the left kidney. 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 Measures a maximum of 18 mm. The bladder is unremarkable. Stomach/Bowel: The stomach, duodenum and small bowel are unremarkable. No acute inflammatory process, mass lesions or obstructive findings. The terminal ileum is normal and the appendix is normal. Focal severe acute diverticulitis involving the mid sigmoid colon with marked wall thickening and submucosal edema. Suspect small intramural  abscess measuring 14 mm. There is also a small adjacent diverticular abscess measuring 14 mm. Significant sigmoid colon diverticulosis. The remainder of the colon is unremarkable. Vascular/Lymphatic: The aorta is normal in caliber. No dissection. The branch vessels are patent. The major venous structures are patent. No mesenteric or retroperitoneal mass or adenopathy. Small scattered lymph nodes are noted. Reproductive: The uterus and ovaries are unremarkable. Other: Marked inflammatory phlegmon in the pelvis but no significant fluid collection or drainable abscess. Musculoskeletal: No significant bony findings. IMPRESSION: 1. Focal severe acute diverticulitis involving the mid sigmoid colon with marked wall thickening and submucosal edema. Suspect small intramural abscess measuring 14 mm. There is also a small adjacent diverticular abscess measuring 14 mm. 2. Marked inflammatory phlegmon in the pelvis but no significant fluid collection or drainable abscess. 3. No other significant abdominal/pelvic findings. These results will be called to the ordering clinician or representative by the Radiologist Assistant, and communication documented in the PACS or [employer]. Electronically Signed   By: Rudie Meyer M.D.   On: 10/27/2022 16:43    Pending Labs Unresulted Labs (From admission, onward)     Start     Ordered   11/03/22 0500  Creatinine,  serum  (enoxaparin (LOVENOX)    CrCl >/= 30 ml/min)  Weekly,   R     Comments: while on enoxaparin therapy    10/27/22 2142   10/28/22 0500  Basic metabolic panel  Tomorrow morning,   R        10/27/22 2142   10/28/22 0500  Magnesium  Tomorrow morning,   R        10/27/22 2142   10/28/22 0500  CBC with Differential/Platelet  Tomorrow morning,   R        10/27/22 2142   10/27/22 2001  Lactic acid, plasma  Now then every 2 hours,   R      10/27/22 2000   10/27/22 2001  Blood culture (routine x 2)  BLOOD CULTURE X 2,   R      10/27/22 2000   10/27/22 1823  Urinalysis, Routine w reflex microscopic -Urine, Clean Catch  Once,   URGENT       Question Answer Comment  Specimen Source Urine, Clean Catch   Release to patient Immediate      10/27/22 1822            Vitals/Pain Today's Vitals   10/27/22 1820 10/27/22 1821 10/27/22 1953  BP: 133/83  126/86  Pulse: 80  67  Resp: 17  12  Temp: 98.1 F (36.7 C)    TempSrc: Oral    SpO2: 99%  99%  Weight:  75 kg   PainSc:  7      Isolation Precautions No active isolations  Medications Medications  enoxaparin (LOVENOX) injection 40 mg (has no administration in time range)  acetaminophen (TYLENOL) tablet 650 mg (has no administration in time range)    Or  acetaminophen (TYLENOL) suppository 650 mg (has no administration in time range)  senna-docusate (Senokot-S) tablet 1 tablet (has no administration in time range)  ondansetron (ZOFRAN) tablet 4 mg (has no administration in time range)    Or  ondansetron (ZOFRAN) injection 4 mg (has no administration in time range)  morphine (PF) 2 MG/ML injection 2 mg (has no administration in time range)  oxyCODONE (Oxy IR/ROXICODONE) immediate release tablet 5 mg (has no administration in time range)  piperacillin-tazobactam (ZOSYN) IVPB 3.375 g (has no administration in time range)  piperacillin-tazobactam (ZOSYN) IVPB 3.375 g (0 g Intravenous Stopped 10/27/22 2106)    Mobility walks        R Recommendations: See Admitting Provider Note  Report given to:   Additional Notes:

## 2022-10-27 NOTE — ED Provider Notes (Signed)
[place] EMERGENCY DEPARTMENT AT [facility] Provider Note   CSN: [id number] Arrival date & time: 10/27/22  1812     History  Chief Complaint  Patient presents with   Abdominal Pain    Ariel Nicholson is a 62 y.o. female.  Patient seen by her PCP today for evaluation of abdominal pain. With tenderness in the RLQ, patient sent for CT of abdomen and pelvis with contrast. Patient had test completed, revealing diverticulitis with abscess. Patient with low bilateral abdominal pain for several days, worsening on Sunday. Denies fever and chills. Has felt bloated, gaseous. No diarrhea. CBC from today's office visit is visible in provider notes. CMET and UA are not available in notes or EPIC.  The history is provided by the patient and medical records.  Abdominal Pain Pain location:  LLQ, RLQ and suprapubic Pain quality: bloating   Pain radiates to:  Does not radiate Pain severity:  Moderate Onset quality:  Gradual Duration:  3 days Timing:  Constant Progression:  Unchanged Chronicity:  New Associated symptoms: anorexia   Associated symptoms: no nausea and no vomiting        Home Medications Prior to Admission medications   Medication Sig Start Date End Date Taking? Authorizing Provider  calcium carbonate (TUMS - DOSED IN MG ELEMENTAL CALCIUM) 500 MG chewable tablet Chew 1 tablet by mouth daily.    [provider]  Cholecalciferol (VITAMIN D) 2000 units CAPS Take 1 capsule by mouth daily.    [provider]  citalopram (CELEXA) 40 MG tablet citalopram 40 mg tablet  Take 1 tablet every day by oral route.    [provider]  clonazePAM (KLONOPIN) 0.5 MG tablet Take 0.25-0.5 mg by mouth 2 (two) times daily as needed. 01/23/19   [provider]  COLLAGEN PO Take 1 tablet by mouth at bedtime.    [provider]  fluticasone (FLONASE) 50 MCG/ACT nasal spray Place 2 sprays into the nose daily as needed for allergies.      [provider]  glucosamine-chondroitin 500-400 MG tablet Take 1 tablet by mouth 2 (two) times daily.    [provider]  irbesartan (AVAPRO) 150 MG tablet irbesartan 150 mg tablet  Take 1 tablet every day by oral route.    [provider]  loratadine (CLARITIN) 10 MG tablet Take 10 mg by mouth daily as needed for allergies.    [provider]  Wheat Dextrin (BENEFIBER DRINK MIX PO) Take 1 scoop by mouth daily.     [provider]      Allergies    Sulfamethoxazole-trimethoprim, Sulfa antibiotics, and Cephalexin    Review of Systems   Review of Systems  Gastrointestinal:  Positive for abdominal pain and anorexia. Negative for nausea and vomiting.  All other systems reviewed and are negative.   Physical Exam Updated Vital Signs BP 133/83 (BP Location: Left Arm)   Pulse 80   Temp 98.1 F (36.7 C) (Oral)   Resp 17   Wt 75 kg   LMP 09/16/2013 (Exact Date)   SpO2 99%   BMI 26.29 kg/m  Physical Exam Vitals reviewed.  Constitutional:      Appearance: She is well-developed.  Eyes:     General: No scleral icterus. Cardiovascular:     Rate and Rhythm: Normal rate.  Pulmonary:     Effort: Pulmonary effort is normal.  Abdominal:     General: Bowel sounds are normal.     Palpations: Abdomen is soft.  Tenderness: There is abdominal tenderness in the right lower quadrant, suprapubic area and left lower quadrant.  Skin:    General: Skin is warm and dry.  Neurological:     Mental Status: She is alert and oriented to person, place, and time.  Psychiatric:        Mood and Affect: Mood normal.        Behavior: Behavior normal.     ED Results / Procedures / Treatments   Labs (all labs ordered are listed, but only abnormal results are displayed) Labs Reviewed  COMPREHENSIVE METABOLIC PANEL - Abnormal; Notable for the following components:      Result Value   Potassium 3.4 (*)    Calcium 8.8 (*)    Alkaline Phosphatase 31 (*)     All other components within normal limits  CBC WITH DIFFERENTIAL/PLATELET - Abnormal; Notable for the following components:   Hemoglobin 11.9 (*)    All other components within normal limits  CULTURE, BLOOD (ROUTINE X 2)  CULTURE, BLOOD (ROUTINE X 2)  URINALYSIS, ROUTINE W REFLEX MICROSCOPIC  LACTIC ACID, PLASMA  LACTIC ACID, PLASMA    Labs from office visit today: WBC 6.39 4.10 - 10.90 K/uL    LYM 1.2 0.6 - 4.1 K/uL    BASO% 0.3 0.0 - 2.0    EOS 0.0 0.0 - 0.4    BASO 0.0 0.0 - 0.2    EOS% 0.6 0.0 - 7.8    LYM% 18.6 10.0 - 58.5 %    RBC 4.2 4.2 - 6.3 M/uL    HGB 12.8 12.0 - 18.0 g/dL    HCT 16.1 09.6 - 04.5 %    MCV 94.7 80.0 - 97.0 fL    MCH 30.5 26.0 - 32.0 pg    MCHC 32.2 31.0 - 36.0 g/dL    RDW 40.9 81.1 - 91.4    PLT 188 140 - 440 K/uL    MPV 8.8 6.9 - 10.6    NEU 4.6 1.4 - 7.0    MONO% 8.6 4.6 - 12.4    MONO 0.6 0.1 - 0.9    NEU% 71.9 43.3 - 71.9    EKG None  Radiology CT ABDOMEN PELVIS W CONTRAST  Result Date: 10/27/2022 CLINICAL DATA:  Lower abdominal pain. EXAM: CT ABDOMEN AND PELVIS WITH CONTRAST TECHNIQUE: Multidetector CT imaging of the abdomen and pelvis was performed using the standard protocol following bolus administration of intravenous contrast. RADIATION DOSE REDUCTION: This exam was performed according to the departmental dose-optimization program which includes automated exposure control, adjustment of the mA and/or kV according to patient size and/or use of iterative reconstruction technique. CONTRAST:  OMNIPAQUE IOHEXOL 300 MG/ML  SOLN COMPARISON:  None Available. FINDINGS: Lower chest: The lung bases are clear of acute process. No pleural effusion or pulmonary lesions. The heart is normal in size. No pericardial effusion. The distal esophagus and aorta are unremarkable. Hepatobiliary: No focal hepatic lesions or intrahepatic biliary dilatation. The gallbladder is normal. No common bile duct dilatation. Pancreas: Normal Spleen: Within normal limits  in size.  No splenic lesions. Adrenals/Urinary Tract: Adrenal glands and kidneys are. There is a small fatty lesion projecting off the medial aspect of the lower pole region of the left kidney. 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 Measures a maximum of 18 mm. The bladder is unremarkable. Stomach/Bowel: The stomach, duodenum and small bowel are unremarkable. No acute inflammatory process, mass lesions or obstructive findings. The terminal ileum is normal and  the appendix is normal. Focal severe acute diverticulitis involving the mid sigmoid colon with marked wall thickening and submucosal edema. Suspect small intramural abscess measuring 14 mm. There is also a small adjacent diverticular abscess measuring 14 mm. Significant sigmoid colon diverticulosis. The remainder of the colon is unremarkable. Vascular/Lymphatic: The aorta is normal in caliber. No dissection. The branch vessels are patent. The major venous structures are patent. No mesenteric or retroperitoneal mass or adenopathy. Small scattered lymph nodes are noted. Reproductive: The uterus and ovaries are unremarkable. Other: Marked inflammatory phlegmon in the pelvis but no significant fluid collection or drainable abscess. Musculoskeletal: No significant bony findings. IMPRESSION: 1. Focal severe acute diverticulitis involving the mid sigmoid colon with marked wall thickening and submucosal edema. Suspect small intramural abscess measuring 14 mm. There is also a small adjacent diverticular abscess measuring 14 mm. 2. Marked inflammatory phlegmon in the pelvis but no significant fluid collection or drainable abscess. 3. No other significant abdominal/pelvic findings. These results will be called to the ordering clinician or representative by the Radiologist Assistant, and communication documented in the PACS or [employer]. Electronically Signed   By: Rudie Meyer M.D.   On: 10/27/2022 16:43     Procedures Procedures    Medications Ordered in ED Medications  metroNIDAZOLE (FLAGYL) IVPB 500 mg (500 mg Intravenous New Bag/Given 10/27/22 2037)  piperacillin-tazobactam (ZOSYN) IVPB 3.375 g (3.375 g Intravenous New Bag/Given 10/27/22 2036)    ED Course/ Medical Decision Making/ A&P                             Medical Decision Making Amount and/or Complexity of Data Reviewed Labs: ordered.  Risk Prescription drug management. Decision regarding hospitalization.   CT reveals diverticulitis of the mid sigmoid colon with adjacent 14 mm diverticular abscess.  No leukocytosis.  General surgery Corliss Skains) spoke with Dr. Silverio Lay. Abscess too small to drain at this time. Admit to medicine. Surgery will follow.  Zosyn and flagyl started in the ED.   Patient with diverticulitis and will require IV antibiotics and admission to the hospital.   Discussed with hospitalist Esmeralda Arthur) for admission.       Final Clinical Impression(s) / ED Diagnoses Final diagnoses:  Diverticulitis    Rx / DC Orders ED Discharge Orders     None         Felicie Morn, NP 10/27/22 2137    Charlynne Pander, MD 10/27/22 2322

## 2022-10-27 NOTE — H&P (Signed)
PCP:   Cleatis Polka., MD   Chief Complaint:  Abdominal pain  HPI: This is a 62 year old female with history of HTN and anxiety.  On Sunday she developed generalized abdominal pain, greatest in the suprapubic area.  She describes the pain as sharp, as needles in her belly.  She states is intermittent but worse with movement.  She denies any fevers, chills, nausea, vomiting or diarrhea.  At its worst the pain was 8/10, currently is 1/10.  Her last colonoscopy was approximately 9 years ago.  Nothing significant was found.    She has no history of diverticulitis.  In the ER CT abdomen and pelvis shows IMPRESSION: 1. Focal severe acute diverticulitis involving the mid sigmoid colon with marked wall thickening and submucosal edema. Suspect small intramural abscess measuring 14 mm. There is also a small adjacent diverticular abscess measuring 14 mm. 2. Marked inflammatory phlegmon in the pelvis but no significant fluid collection or drainable abscess.  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 Authorizing Provider  calcium carbonate (TUMS - DOSED IN MG ELEMENTAL CALCIUM) 500 MG chewable tablet Chew 1 tablet by mouth daily.   Yes [provider]  citalopram (CELEXA) 40 MG tablet Take 40 mg by mouth daily.   Yes [provider]  irbesartan (AVAPRO) 150 MG tablet Take 150 mg by mouth daily.   Yes [provider]  rosuvastatin (CRESTOR) 10 MG tablet Take 10 mg by mouth daily. 08/03/22  Yes [provider]  Cholecalciferol (VITAMIN D) 2000 units CAPS Take 1 capsule by mouth daily.    [provider]  clonazePAM (KLONOPIN) 0.5 MG tablet Take 0.25-0.5 mg by mouth 2 (two) times daily as needed. 01/23/19   [provider]  COLLAGEN PO Take 1 tablet by mouth at bedtime.    [provider]  fluticasone (FLONASE) 50 MCG/ACT nasal spray Place 2 sprays into the nose daily as needed for allergies.     [provider]  glucosamine-chondroitin 500-400 MG tablet Take 1 tablet by mouth 2 (two) times daily.    [provider]  loratadine (CLARITIN) 10 MG tablet Take 10 mg by mouth daily as needed for allergies.    [provider]  Wheat Dextrin (BENEFIBER DRINK MIX PO) Take 1 scoop by mouth daily.     [provider]    Allergies:   Allergies  Allergen Reactions   Sulfamethoxazole-Trimethoprim Hives and Other (See Comments)   Sulfa Antibiotics Other (See Comments)   Cephalexin Hives and Other (See Comments)    Social History:  reports that she has never smoked. She has never used smokeless tobacco. She reports  current alcohol use of about 3.0 standard drinks of alcohol per week. She reports that she does not use drugs.  Family History: Family History  Problem Relation Age of Onset   Hypertension Mother    Osteoporosis Mother        after age 62   Pancreatic cancer Mother        d. 40   Hypertension Father    Heart disease Father    Heart attack Father    Osteoporosis Maternal Grandmother    Colon cancer Maternal Grandfather     Physical Exam: Vitals:   10/27/22 1820 10/27/22 1821 10/27/22 1953  BP: 133/83  126/86  Pulse: 80  67  Resp: 17  12   Temp: 98.1 F (36.7 C)    TempSrc: Oral    SpO2: 99%  99%  Weight:  75 kg     General:  Alert and oriented times three, well developed and nourished, no acute distress Eyes: PERRLA, pink conjunctiva, no scleral icterus ENT: Moist oral mucosa, neck supple, no thyromegaly Lungs: clear to ascultation, no wheeze, no crackles, no use of accessory muscles Cardiovascular: regular rate and rhythm, no regurgitation, no gallops, no murmurs. No carotid bruits, no JVD Abdomen: soft, positive BS, positive TTP suprapubic area, no organomegaly, not an acute abdomen GU: not examined Neuro: CN II - XII grossly intact, sensation intact Musculoskeletal: strength 5/5 all extremities, no clubbing, cyanosis or edema Skin: no rash, no subcutaneous crepitation, no decubitus Psych: appropriate patient  Labs on Admission:  Recent Labs    10/27/22 1952  NA 136  K 3.4*  CL 104  CO2 24  GLUCOSE 85  BUN 11  CREATININE 0.66  CALCIUM 8.8*   Recent Labs    10/27/22 1952  AST 19  ALT 16  ALKPHOS 31*  BILITOT 1.2  PROT 6.7  ALBUMIN 3.8    Recent Labs    10/27/22 1952  WBC 5.0  NEUTROABS 3.4  HGB 11.9*  HCT 36.1  MCV 91.2  PLT 159    Radiological Exams on Admission: CT ABDOMEN PELVIS W CONTRAST  Result Date: 10/27/2022 CLINICAL DATA:  Lower abdominal pain. EXAM: CT ABDOMEN AND PELVIS WITH CONTRAST TECHNIQUE: Multidetector CT imaging of the abdomen and pelvis was performed using the standard protocol following bolus administration of intravenous contrast. RADIATION DOSE REDUCTION: This exam was performed according to the departmental dose-optimization program which includes automated exposure control, adjustment of the mA and/or kV according to patient size and/or use of iterative reconstruction technique. CONTRAST:  OMNIPAQUE IOHEXOL 300 MG/ML  SOLN COMPARISON:  None Available. FINDINGS: Lower chest: The lung bases are clear of acute process. No pleural effusion or pulmonary lesions. The  heart is normal in size. No pericardial effusion. The distal esophagus and aorta are unremarkable. Hepatobiliary: No focal hepatic lesions or intrahepatic biliary dilatation. The gallbladder is normal. No common bile duct dilatation. Pancreas: Normal Spleen: Within normal limits in size.  No splenic lesions. Adrenals/Urinary Tract: Adrenal glands and kidneys are. There is a small fatty lesion projecting off the medial aspect of the lower pole region of the left kidney. 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 Measures a maximum of 18 mm. The bladder is unremarkable. Stomach/Bowel: The stomach, duodenum and small bowel are unremarkable. No acute inflammatory process, mass lesions or obstructive findings. The terminal ileum is normal and the appendix is normal. Focal severe acute diverticulitis involving the mid sigmoid colon with marked wall thickening and submucosal edema.  Suspect small intramural abscess measuring 14 mm. There is also a small adjacent diverticular abscess measuring 14 mm. Significant sigmoid colon diverticulosis. The remainder of the colon is unremarkable. Vascular/Lymphatic: The aorta is normal in caliber. No dissection. The branch vessels are patent. The major venous structures are patent. No mesenteric or retroperitoneal mass or adenopathy. Small scattered lymph nodes are noted. Reproductive: The uterus and ovaries are unremarkable. Other: Marked inflammatory phlegmon in the pelvis but no significant fluid collection or drainable abscess. Musculoskeletal: No significant bony findings. IMPRESSION: 1. Focal severe acute diverticulitis involving the mid sigmoid colon with marked wall thickening and submucosal edema. Suspect small intramural abscess measuring 14 mm. There is also a small adjacent diverticular abscess measuring 14 mm. 2. Marked inflammatory phlegmon in the pelvis but no significant fluid collection or drainable abscess. 3. No other significant  abdominal/pelvic findings. These results will be called to the ordering clinician or representative by the Radiologist Assistant, and communication documented in the PACS or [employer]. Electronically Signed   By: Rudie Meyer M.D.   On: 10/27/2022 16:43    Assessment/Plan Present on Admission:  Acute diverticulitis -IV Zosyn ordered -Morphine as needed -IV fluid hydration -Surgeon Dr Corliss Skains contacted by EDP, will see patient in the a.m. -IV Zofran as needed  HTN -Avapro resumed  HLD -Crestor resumed  Anxiety -Resume Celexa and Klonopin  Ariel Nicholson 10/27/2022, 9:38 PM

## 2022-10-27 NOTE — ED Triage Notes (Signed)
C/o abd pain to RLQ radiating into suprapubic with tenderness x 6days, pain worsened 2 days ago.  Pain worse with movement and better after having BM Also c/o bloating, gas, and decreased appetite.

## 2022-10-28 DIAGNOSIS — K5792 Diverticulitis of intestine, part unspecified, without perforation or abscess without bleeding: Secondary | ICD-10-CM | POA: Diagnosis not present

## 2022-10-28 LAB — CBC WITH DIFFERENTIAL/PLATELET
Abs Immature Granulocytes: 0.01 10*3/uL (ref 0.00–0.07)
Basophils Absolute: 0 10*3/uL (ref 0.0–0.1)
Basophils Relative: 0 %
Eosinophils Absolute: 0.1 10*3/uL (ref 0.0–0.5)
Eosinophils Relative: 2 %
HCT: 35.9 % — ABNORMAL LOW (ref 36.0–46.0)
Hemoglobin: 11.9 g/dL — ABNORMAL LOW (ref 12.0–15.0)
Immature Granulocytes: 0 %
Lymphocytes Relative: 25 %
Lymphs Abs: 1.2 10*3/uL (ref 0.7–4.0)
MCH: 30.3 pg (ref 26.0–34.0)
MCHC: 33.1 g/dL (ref 30.0–36.0)
MCV: 91.3 fL (ref 80.0–100.0)
Monocytes Absolute: 0.4 10*3/uL (ref 0.1–1.0)
Monocytes Relative: 8 %
Neutro Abs: 3.1 10*3/uL (ref 1.7–7.7)
Neutrophils Relative %: 65 %
Platelets: 153 10*3/uL (ref 150–400)
RBC: 3.93 MIL/uL (ref 3.87–5.11)
RDW: 12.8 % (ref 11.5–15.5)
WBC: 4.8 10*3/uL (ref 4.0–10.5)
nRBC: 0 % (ref 0.0–0.2)

## 2022-10-28 LAB — LACTIC ACID, PLASMA: Lactic Acid, Venous: 0.5 mmol/L (ref 0.5–1.9)

## 2022-10-28 LAB — BASIC METABOLIC PANEL
Anion gap: 9 (ref 5–15)
BUN: 10 mg/dL (ref 8–23)
CO2: 23 mmol/L (ref 22–32)
Calcium: 8.6 mg/dL — ABNORMAL LOW (ref 8.9–10.3)
Chloride: 103 mmol/L (ref 98–111)
Creatinine, Ser: 0.57 mg/dL (ref 0.44–1.00)
GFR, Estimated: >60 mL/min (ref >60)
Glucose, Bld: 97 mg/dL (ref 70–99)
Potassium: 3.3 mmol/L — ABNORMAL LOW (ref 3.5–5.1)
Sodium: 135 mmol/L (ref 135–145)

## 2022-10-28 LAB — MAGNESIUM: Magnesium: 2.2 mg/dL (ref 1.7–2.4)

## 2022-10-28 MED ORDER — POTASSIUM CHLORIDE CRYS ER 20 MEQ PO TBCR
40.0000 meq | EXTENDED_RELEASE_TABLET | Freq: Once | ORAL | Status: AC
  Administered 2022-10-28: 40 meq via ORAL
  Filled 2022-10-28: qty 2

## 2022-10-28 MED ORDER — IRBESARTAN 150 MG PO TABS
150.0000 mg | ORAL_TABLET | Freq: Every evening | ORAL | Status: DC
  Filled 2022-10-28 (×2): qty 1

## 2022-10-28 MED ORDER — CITALOPRAM HYDROBROMIDE 20 MG PO TABS
40.0000 mg | ORAL_TABLET | Freq: Every evening | ORAL | Status: DC
  Filled 2022-10-28 (×2): qty 2

## 2022-10-28 MED ORDER — ROSUVASTATIN CALCIUM 10 MG PO TABS
10.0000 mg | ORAL_TABLET | Freq: Every evening | ORAL | Status: DC
  Filled 2022-10-28 (×2): qty 1

## 2022-10-28 NOTE — Progress Notes (Signed)
62 year old F patient presented with 3 days of lower abdominal pain. CT performed and diverticulitis with small abscess noted. General surgery states abscess is too small to drain and will continue IV abx for 5 days. Pt alert and oriented x4 with no motor deficits noted. Lungs clear to auscultation. Heart rate and rhythm normal. Pedal pulses equal and bilateral with no swelling to the extremities. Patient is currently at a pain level of 1 /10. General surgery states no need for surgery at this time. Will continue with IV abx and PRN pain medication.       Holiday representative -BSN student

## 2022-10-28 NOTE — Consult Note (Addendum)
Ariel Nicholson Sharen Hint [birth year]  [id number].    Requesting MD: Joneen Roach, MD Chief Complaint/Reason for Consult: diverticulitis with small abscess  HPI:  62 y/o F with PMH HTN and HLD who presents with 3 days of lower abdominal pain. States pain started Sunday over her lower abdomen and was described as constant, worse with movement. Describes sharp exacerbations of the pain that felt like "nails stabbing her intestines" that were most severe on Monday. Associated with constipation/decreased caliber of stools, decreased appetite. Denies fever, chills, hematochezia, melena, nausea, or vomiting. Denies urinary sxs. Denies a known history of diverticulitis. Last colonoscopy was 9 years ago and she reports no polyp removal or concerning findings.   Blood thinners: none Past abdominal surgeries: surgical removal of ovarian cyst 20 years ago via Pfannenstiel incision. Employment: Wellsite geologist at [employer] Social history: denies tobacco use, occasional EtOH  ROS: As above  Review of Systems  All other systems reviewed and are negative.   Family History  Problem Relation Age of Onset   Hypertension Mother    Osteoporosis Mother        after age 95   Pancreatic cancer Mother        d. 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 She has never used smokeless tobacco. She reports current alcohol use of about 3.0 standard drinks of alcohol per week. She reports that she does not use drugs.  Allergies:  Allergies  Allergen Reactions   Sulfamethoxazole-Trimethoprim Hives and Other (See Comments)   Sulfa Antibiotics Other (See Comments)   Cephalexin Hives and Other (See Comments)    Medications Prior to Admission  Medication Sig Dispense Refill   citalopram (CELEXA) 40 MG tablet Take 40 mg by mouth every evening.     irbesartan (AVAPRO) 150 MG tablet Take 150 mg by mouth every evening.     rosuvastatin (CRESTOR) 10 MG tablet Take 10 mg by mouth every evening.       Physical Exam: Blood pressure (!) 134/91, pulse 65, temperature 97.9 F (36.6 C), temperature source Oral, resp. rate 17, weight 75 kg, last menstrual period 09/16/2013, SpO2 97 %. General: Pleasant female laying on hospital bed, appears stated age, NAD. HEENT: head -normocephalic, atraumatic; Eyes: anicteric sclerae Neck- Trachea is midline, no thyromegaly or JVD appreciated.  CV- RRR, normal S1/S2, no M/R/G, no lower extremity edema  Pulm- breathing is non-labored ORA Abd- soft, there is mild tenderness to palpation of the suprapubic region, LLQ, RLQ without guarding or rebound tenderness, no hernias or masses  GU- deferred  MSK- UE/LE symmetrical, no cyanosis, clubbing, or edema. Neuro- CN II-XII grossly in tact, no paresthesias. Psych- Alert and Oriented x3 with appropriate affect Skin: warm and dry, no rashes or lesions   Results for orders placed or performed during the hospital encounter of 10/27/22 (from the past 48 hour(s))  Comprehensive metabolic panel     Status: Abnormal   Collection Time: 10/27/22  7:52 PM  Result Value Ref Range   Sodium 136 135 - 145 mmol/L   Potassium 3.4  (L) 3.5 - 5.1 mmol/L   Chloride 104 98 - 111 mmol/L   CO2 24 22 - 32 mmol/L   Glucose, Bld 85 70 - 99 mg/dL    Comment: Glucose reference range applies only to samples taken after fasting for at least 8 hours.   BUN 11 8 - 23 mg/dL   Creatinine, Ser 1.19 0.44 - 1.00 mg/dL   Calcium 8.8 (L) 8.9 - 10.3 mg/dL   Total Protein 6.7 6.5 - 8.1 g/dL   Albumin 3.8 3.5 - 5.0 g/dL   AST 19 15 - 41 U/L   ALT 16 0 - 44 U/L   Alkaline Phosphatase 31 (L) 38 - 126 U/L   Total Bilirubin 1.2 0.3 - 1.2 mg/dL   GFR, Estimated >14 >78 mL/min    Comment: (NOTE) Calculated using the CKD-EPI Creatinine Equation (2021)    Anion gap 8 5 - 15    Comment: Performed at [facility], [address], [place], [place] [address]  CBC with Differential     Status: Abnormal   Collection Time: 10/27/22  7:52 PM  Result Value Ref Range   WBC 5.0 4.0 - 10.5 K/uL   RBC 3.96 3.87 - 5.11 MIL/uL   Hemoglobin 11.9 (L) 12.0 - 15.0 g/dL   HCT 13.0 86.5 - 78.4 %   MCV 91.2 80.0 - 100.0 fL   MCH 30.1 26.0 - 34.0 pg   MCHC 33.0 30.0 - 36.0 g/dL   RDW 69.6 29.5 - 28.4 %   Platelets 159 150 - 400 K/uL   nRBC 0.0 0.0 - 0.2 %   Neutrophils Relative % 68 %   Neutro Abs 3.4 1.7 - 7.7 K/uL   Lymphocytes Relative 21 %   Lymphs Abs 1.1 0.7 - 4.0 K/uL   Monocytes Relative 10 %   Monocytes Absolute 0.5 0.1 - 1.0 K/uL   Eosinophils Relative 1 %   Eosinophils Absolute 0.1 0.0 - 0.5 K/uL   Basophils Relative 0 %   Basophils Absolute 0.0 0.0 - 0.1 K/uL   Immature Granulocytes 0 %   Abs Immature Granulocytes 0.02 0.00 - 0.07 K/uL    Comment: Performed at [facility], [address], [place], [place] [address]  Blood culture (routine x 2)     Status: None (Preliminary result)   Collection Time: 10/27/22  8:25 PM   Specimen: BLOOD RIGHT HAND  Result Value Ref Range   Specimen Description      BLOOD RIGHT HAND Performed at [facility], [address], [place], [place] [address]     Special Requests      BOTTLES DRAWN AEROBIC AND ANAEROBIC Blood Culture adequate volume Performed at [facility], [address], [place], [place] [address]    Culture      NO GROWTH < 12 HOURS Performed at [facility], [address], [place], [place] [address]    Report Status PENDING  Lactic acid, plasma     Status: None   Collection Time: 10/27/22  8:37 PM  Result Value Ref Range   Lactic Acid, Venous 0.6 0.5 - 1.9 mmol/L    Comment: Performed at [facility], [address] [place], [place] [address]  Blood culture (routine x 2)     Status: None (Preliminary result)   Collection Time: 10/27/22  8:37 PM   Specimen: BLOOD  Result Value Ref Range   Specimen Description      BLOOD RIGHT ANTECUBITAL Performed at [facility], [address] [place], [place] [address]    Special Requests      BOTTLES DRAWN AEROBIC AND ANAEROBIC Blood Culture adequate volume Performed at [facility], [address] [place], [place] [address]    Culture      NO GROWTH < 12 HOURS Performed at [facility], [address], [place], [place] [address]    Report Status PENDING   Urinalysis, Routine w reflex microscopic -Urine, Clean Catch     Status: Abnormal   Collection Time: 10/27/22  9:52 PM  Result Value Ref Range   Color, Urine STRAW (A) YELLOW   APPearance CLEAR CLEAR   Specific Gravity, Urine 1.034 (H) 1.005 - 1.030   pH 6.0 5.0 - 8.0   Glucose, UA NEGATIVE NEGATIVE mg/dL   Hgb urine dipstick NEGATIVE NEGATIVE   Bilirubin Urine NEGATIVE NEGATIVE   Ketones, ur NEGATIVE NEGATIVE mg/dL   Protein, ur NEGATIVE NEGATIVE mg/dL   Nitrite NEGATIVE NEGATIVE   Leukocytes,Ua NEGATIVE NEGATIVE    Comment: Performed at [facility], [address], [place], [place] [address]  Lactic acid, plasma     Status: None   Collection Time: 10/28/22 12:34 AM  Result Value Ref Range   Lactic Acid,  Venous 0.5 0.5 - 1.9 mmol/L    Comment: Performed at [facility], [address] [place], [place] [address]  Basic metabolic panel     Status: Abnormal   Collection Time: 10/28/22 12:34 AM  Result Value Ref Range   Sodium 135 135 - 145 mmol/L   Potassium 3.3 (L) 3.5 - 5.1 mmol/L   Chloride 103 98 - 111 mmol/L   CO2 23 22 - 32 mmol/L   Glucose, Bld 97 70 - 99 mg/dL    Comment: Glucose reference range applies only to samples taken after fasting for at least 8 hours.   BUN 10 8 - 23 mg/dL   Creatinine, Ser 5.78 0.44 - 1.00 mg/dL   Calcium 8.6 (L) 8.9 - 10.3 mg/dL   GFR, Estimated >46 >96 mL/min    Comment: (NOTE) Calculated using the CKD-EPI Creatinine Equation (2021)    Anion gap 9 5 - 15    Comment: Performed at [facility], [address] [place], [place] [address]  Magnesium     Status: None   Collection Time: 10/28/22 12:34 AM  Result Value Ref Range   Magnesium 2.2 1.7 - 2.4 mg/dL    Comment: Performed at [facility], [address] [place], [place] [address]  CBC with Differential/Platelet     Status: Abnormal   Collection Time: 10/28/22 12:34 AM  Result Value Ref Range   WBC 4.8 4.0 - 10.5 K/uL   RBC 3.93 3.87 - 5.11 MIL/uL   Hemoglobin 11.9 (L) 12.0 - 15.0 g/dL   HCT 40.1 (L) 02.7 - 25.3 %   MCV 91.3 80.0 -  100.0 fL   MCH 30.3 26.0 - 34.0 pg   MCHC 33.1 30.0 - 36.0 g/dL   RDW 16.1 09.6 - 04.5 %   Platelets 153 150 - 400 K/uL   nRBC 0.0 0.0 - 0.2 %   Neutrophils Relative % 65 %   Neutro Abs 3.1 1.7 - 7.7 K/uL   Lymphocytes Relative 25 %   Lymphs Abs 1.2 0.7 - 4.0 K/uL   Monocytes Relative 8 %   Monocytes Absolute 0.4 0.1 - 1.0 K/uL   Eosinophils Relative 2 %   Eosinophils Absolute 0.1 0.0 - 0.5 K/uL   Basophils Relative 0 %   Basophils Absolute 0.0 0.0 - 0.1 K/uL   Immature Granulocytes 0 %   Abs Immature Granulocytes 0.01 0.00 - 0.07 K/uL    Comment: Performed at [facility], [address] [place], [place] [address]   CT ABDOMEN PELVIS W CONTRAST  Result Date: 10/27/2022 CLINICAL DATA:  Lower abdominal pain. EXAM: CT ABDOMEN AND PELVIS WITH CONTRAST TECHNIQUE: Multidetector CT imaging of the abdomen and pelvis was performed using the standard protocol following bolus administration of intravenous contrast. RADIATION DOSE REDUCTION: This exam was performed according to the departmental dose-optimization program which includes automated exposure control, adjustment of the mA and/or kV according to patient size and/or use of iterative reconstruction technique. CONTRAST:  OMNIPAQUE IOHEXOL 300 MG/ML  SOLN COMPARISON:  None Available. FINDINGS: Lower chest: The lung bases are clear of acute process. No pleural effusion or pulmonary lesions. The heart is normal in size. No pericardial effusion. The distal esophagus and aorta are unremarkable. Hepatobiliary: No focal hepatic lesions or intrahepatic biliary dilatation. The gallbladder is normal. No common bile duct dilatation. Pancreas: Normal Spleen: Within normal limits in size.  No splenic lesions. Adrenals/Urinary Tract: Adrenal glands and kidneys are. There is a small fatty lesion projecting off the medial aspect of the lower pole region of the left kidney. 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 Measures a maximum of 18 mm. The bladder is unremarkable. Stomach/Bowel: The stomach, duodenum and small bowel are unremarkable. No acute inflammatory process, mass lesions or obstructive findings. The terminal ileum is normal and the appendix is normal. Focal severe acute diverticulitis involving the mid sigmoid colon with marked wall thickening and submucosal edema. Suspect small intramural abscess measuring 14 mm. There is also a small adjacent diverticular abscess measuring 14 mm. Significant sigmoid colon diverticulosis. The remainder of the colon is unremarkable. Vascular/Lymphatic: The aorta is  normal in caliber. No dissection. The branch vessels are patent. The major venous structures are patent. No mesenteric or retroperitoneal mass or adenopathy. Small scattered lymph nodes are noted. Reproductive: The uterus and ovaries are unremarkable. Other: Marked inflammatory phlegmon in the pelvis but no significant fluid collection or drainable abscess. Musculoskeletal: No significant bony findings. IMPRESSION: 1. Focal severe acute diverticulitis involving the mid sigmoid colon with marked wall thickening and submucosal edema. Suspect small intramural abscess measuring 14 mm. There is also a small adjacent diverticular abscess measuring 14 mm. 2. Marked inflammatory phlegmon in the pelvis but no significant fluid collection or drainable abscess. 3. No other significant abdominal/pelvic findings. These results will be called to the ordering clinician or representative by the Radiologist Assistant, and communication documented in the PACS or [employer]. Electronically Signed   By: Rudie Meyer M.D.   On: 10/27/2022 16:43      Assessment/Plan Acute sigmoid diverticulitis with very small intra-mural and peri-diverticular abscesses -  afebrile, VSS, WBC 5 - no emergent surgical needs today. Ok for full liquid diet. Continue IV abx.  - if she continues to improve with non-operative measures then she will need an outpatient colonoscopy in about 6 weeks, followed by outpatient appointment with one of our colorectal surgeons to discuss option of elective partial colectomy.  - [facility] will follow    I reviewed nursing notes, ED provider notes, hospitalist notes, last 24 h vitals and pain scores, last 48 h intake and output, last 24 h labs and trends, and last 24 h imaging results.  Adam Phenix, PA-C [facility] 10/28/2022, 8:30 AM Please see Amion for pager number during day hours 7:00am-4:30pm or 7:00am -11:30am on weekends

## 2022-10-28 NOTE — Progress Notes (Signed)
PROGRESS NOTE  Ariel Nicholson UEA:[id number] DOB: 05-04-1961 DOA: 10/27/2022 PCP: Cleatis Polka., MD  HPI/Recap of past 29 hours: 62 year old female with history of HTN and anxiety. Presents with generalized abdominal pain, greatest in the suprapubic area X 3 days. She denies any fevers, chills, nausea, vomiting or diarrhea. Her last colonoscopy was approximately 9 years ago. Nothing significant was found. She has no prior history of diverticulitis.  CT abdomen/pelvis showed focal severe acute diverticulitis, with noted very small abscess, not drainable.  General surgery consulted.  Patient admitted for further management.   Today, patient still with abdominal pain, denies any nausea/vomiting.  Patient able to tolerate full liquid diet.   Assessment/Plan: Principal Problem:   Acute diverticulitis Active Problems:   HTN (hypertension)   Hyperlipidemia   Anxiety   Acute diverticulitis with very small intramural and peridiverticular abscess Currently afebrile, with no leukocytosis CT abdomen showed above General surgery consulted, no plans for surgery for now Continue IV Zosyn Continue full liquid diet Pain management, antiemetic as needed  Hypokalemia Replace as needed   HTN Avapro resumed   HLD Crestor resumed   Anxiety Resume Celexa and Klonopin    Estimated body mass index is 26.29 kg/m as calculated from the following:   Height as of 07/09/22: 5' 6.5" (1.689 m).   Weight as of this encounter: 75 kg.     Code Status: Full  Family Communication: None at bedside  Disposition Plan: Status is: Inpatient Remains inpatient appropriate because: Level of care      Consultants: General surgery  Procedures: None  Antimicrobials: IV Zosyn  DVT prophylaxis: Lovenox   Objective: Vitals:   10/28/22 0307 10/28/22 0648 10/28/22 1243 10/28/22 1624  BP: 120/86 (!) 134/91 (!) 143/75 124/85  Pulse: 67 65 62 64  Resp: 18 17 17 17   Temp: 98.2 F (36.8  C) 97.9 F (36.6 C) 98.5 F (36.9 C) 98.3 F (36.8 C)  TempSrc: Oral Oral Oral Oral  SpO2: 98% 97% 95% 99%  Weight:        Intake/Output Summary (Last 24 hours) at 10/28/2022 1759 Last data filed at 10/28/2022 1624 Gross per 24 hour  Intake 1397.16 ml  Output 800 ml  Net 597.16 ml   Filed Weights   10/27/22 1821  Weight: 75 kg    Exam: General: NAD  Cardiovascular: S1, S2 present Respiratory: CTAB Abdomen: Soft, tender, nondistended, bowel sounds present Musculoskeletal: No bilateral pedal edema noted Skin: Normal Psychiatry: Normal mood    Data Reviewed: CBC: Recent Labs  Lab 10/27/22 1952 10/28/22 0034  WBC 5.0 4.8  NEUTROABS 3.4 3.1  HGB 11.9* 11.9*  HCT 36.1 35.9*  MCV 91.2 91.3  PLT 159 153   Basic Metabolic Panel: Recent Labs  Lab 10/27/22 1952 10/28/22 0034  NA 136 135  K 3.4* 3.3*  CL 104 103  CO2 24 23  GLUCOSE 85 97  BUN 11 10  CREATININE 0.66 0.57  CALCIUM 8.8* 8.6*  MG  --  2.2   GFR: Estimated Creatinine Clearance: 77.3 mL/min (by C-G formula based on SCr of 0.57 mg/dL). Liver Function Tests: Recent Labs  Lab 10/27/22 1952  AST 19  ALT 16  ALKPHOS 31*  BILITOT 1.2  PROT 6.7  ALBUMIN 3.8   No results for input(s): "LIPASE", "AMYLASE" in the last 168 hours. No results for input(s): "AMMONIA" in the last 168 hours. Coagulation Profile: No results for input(s): "INR", "PROTIME" in the last 168 hours. Cardiac Enzymes: No results for  input(s): "CKTOTAL", "CKMB", "CKMBINDEX", "TROPONINI" in the last 168 hours. BNP (last 3 results) No results for input(s): "PROBNP" in the last 8760 hours. HbA1C: No results for input(s): "HGBA1C" in the last 72 hours. CBG: No results for input(s): "GLUCAP" in the last 168 hours. Lipid Profile: No results for input(s): "CHOL", "HDL", "LDLCALC", "TRIG", "CHOLHDL", "LDLDIRECT" in the last 72 hours. Thyroid Function Tests: No results for input(s): "TSH", "T4TOTAL", "FREET4", "T3FREE", "THYROIDAB"  in the last 72 hours. Anemia Panel: No results for input(s): "VITAMINB12", "FOLATE", "FERRITIN", "TIBC", "IRON", "RETICCTPCT" in the last 72 hours. Urine analysis:    Component Value Date/Time   COLORURINE STRAW (A) 10/27/2022 2152   APPEARANCEUR CLEAR 10/27/2022 2152   LABSPEC 1.034 (H) 10/27/2022 2152   PHURINE 6.0 10/27/2022 2152   GLUCOSEU NEGATIVE 10/27/2022 2152   HGBUR NEGATIVE 10/27/2022 2152   BILIRUBINUR NEGATIVE 10/27/2022 2152   BILIRUBINUR neg 01/09/2014 1525   KETONESUR NEGATIVE 10/27/2022 2152   PROTEINUR NEGATIVE 10/27/2022 2152   UROBILINOGEN negative 01/09/2014 1525   NITRITE NEGATIVE 10/27/2022 2152   LEUKOCYTESUR NEGATIVE 10/27/2022 2152   Sepsis Labs: @LABRCNTIP (procalcitonin:4,lacticidven:4)  ) Recent Results (from the past 240 hour(s))  Blood culture (routine x 2)     Status: None (Preliminary result)   Collection Time: 10/27/22  8:25 PM   Specimen: BLOOD RIGHT HAND  Result Value Ref Range Status   Specimen Description   Final    BLOOD RIGHT HAND Performed at [facility], [address] [place], [place] [address]    Special Requests   Final    BOTTLES DRAWN AEROBIC AND ANAEROBIC Blood Culture adequate volume Performed at [facility], [address], [place], [place] [address]    Culture   Final    NO GROWTH < 12 HOURS Performed at [facility], [address], [place], [place] [address]    Report Status PENDING  Incomplete  Blood culture (routine x 2)     Status: None (Preliminary result)   Collection Time: 10/27/22  8:37 PM   Specimen: BLOOD  Result Value Ref Range Status   Specimen Description   Final    BLOOD RIGHT ANTECUBITAL Performed at [facility], [address], [place], [place] [address]    Special Requests   Final    BOTTLES DRAWN AEROBIC AND ANAEROBIC Blood Culture adequate volume Performed at [facility], [address] [place], [place] [address]     Culture   Final    NO GROWTH < 12 HOURS Performed at [facility], [address] [place], [place] [address]    Report Status PENDING  Incomplete      Studies: No results found.  Scheduled Meds:  citalopram  40 mg Oral QPM   enoxaparin (LOVENOX) injection  40 mg Subcutaneous Q24H   irbesartan  150 mg Oral QPM   rosuvastatin  10 mg Oral QPM    Continuous Infusions:  piperacillin-tazobactam 3.375 g (10/28/22 1125)     LOS: 1 day     Briant Cedar, MD [facility]  If 7PM-7AM, please contact night-coverage [email or web address] 10/28/2022, 5:59 PM

## 2022-10-29 ENCOUNTER — Other Ambulatory Visit: Payer: Managed Care, Other (non HMO)

## 2022-10-29 DIAGNOSIS — K5792 Diverticulitis of intestine, part unspecified, without perforation or abscess without bleeding: Secondary | ICD-10-CM | POA: Diagnosis not present

## 2022-10-29 LAB — CBC WITH DIFFERENTIAL/PLATELET
Abs Immature Granulocytes: 0.02 10*3/uL (ref 0.00–0.07)
Basophils Absolute: 0 10*3/uL (ref 0.0–0.1)
Basophils Relative: 1 %
Eosinophils Absolute: 0.1 10*3/uL (ref 0.0–0.5)
Eosinophils Relative: 3 %
HCT: 37.9 % (ref 36.0–46.0)
Hemoglobin: 12.4 g/dL (ref 12.0–15.0)
Immature Granulocytes: 1 %
Lymphocytes Relative: 25 %
Lymphs Abs: 1 10*3/uL (ref 0.7–4.0)
MCH: 30.1 pg (ref 26.0–34.0)
MCHC: 32.7 g/dL (ref 30.0–36.0)
MCV: 92 fL (ref 80.0–100.0)
Monocytes Absolute: 0.4 10*3/uL (ref 0.1–1.0)
Monocytes Relative: 10 %
Neutro Abs: 2.3 10*3/uL (ref 1.7–7.7)
Neutrophils Relative %: 60 %
Platelets: 163 10*3/uL (ref 150–400)
RBC: 4.12 MIL/uL (ref 3.87–5.11)
RDW: 12.6 % (ref 11.5–15.5)
WBC: 3.8 10*3/uL — ABNORMAL LOW (ref 4.0–10.5)
nRBC: 0 % (ref 0.0–0.2)

## 2022-10-29 LAB — BASIC METABOLIC PANEL
Anion gap: 7 (ref 5–15)
BUN: 8 mg/dL (ref 8–23)
CO2: 27 mmol/L (ref 22–32)
Calcium: 9 mg/dL (ref 8.9–10.3)
Chloride: 105 mmol/L (ref 98–111)
Creatinine, Ser: 0.84 mg/dL (ref 0.44–1.00)
GFR, Estimated: >60 mL/min (ref >60)
Glucose, Bld: 107 mg/dL — ABNORMAL HIGH (ref 70–99)
Potassium: 4.7 mmol/L (ref 3.5–5.1)
Sodium: 139 mmol/L (ref 135–145)

## 2022-10-29 NOTE — Progress Notes (Addendum)
[facility] Progress Note     Subjective: CC:  Reports feeling better. Less abd pain. Tolerating FLD without pain, nausea, or vomiting. One loose stool yesterday  Objective: Vital signs in last 24 hours: Temp:  [97.9 F (36.6 C)-98.5 F (36.9 C)] 97.9 F (36.6 C) (05/23 0500) Pulse Rate:  [61-64] 61 (05/23 0500) Resp:  [17-18] 18 (05/23 0500) BP: (115-143)/(75-85) 129/82 (05/23 0500) SpO2:  [95 %-100 %] 100 % (05/23 0500) Last BM Date : 10/28/22  Intake/Output from previous day: 05/22 0701 - 05/23 0700 In: 2092.5 [P.O.:1920; IV Piggyback:172.5] Out: 2700 [Urine:2700] Intake/Output this shift: Total I/O In: 240 [P.O.:240] Out: 0   PE: Gen:  Alert, NAD, pleasant Card:  Regular rate and rhythm Pulm:  Normal effort ORA Abd: Soft, non-tender, non-distended, +BS, no hernias or masses Skin: warm and dry, no rashes  Psych: A&Ox3   Lab Results:  Recent Labs    10/28/22 0034 10/29/22 0453  WBC 4.8 3.8*  HGB 11.9* 12.4  HCT 35.9* 37.9  PLT 153 163   BMET Recent Labs    10/28/22 0034 10/29/22 0453  NA 135 139  K 3.3* 4.7  CL 103 105  CO2 23 27  GLUCOSE 97 107*  BUN 10 8  CREATININE 0.57 0.84  CALCIUM 8.6* 9.0   PT/INR No results for input(s): "LABPROT", "INR" in the last 72 hours. CMP     Component Value Date/Time   NA 139 10/29/2022 0453   K 4.7 10/29/2022 0453   CL 105 10/29/2022 0453   CO2 27 10/29/2022 0453   GLUCOSE 107 (H) 10/29/2022 0453   BUN 8 10/29/2022 0453   CREATININE 0.84 10/29/2022 0453   CREATININE 0.87 01/12/2014 0942   CALCIUM 9.0 10/29/2022 0453   PROT 6.7 10/27/2022 1952   ALBUMIN 3.8 10/27/2022 1952   AST 19 10/27/2022 1952   ALT 16 10/27/2022 1952   ALKPHOS 31 (L) 10/27/2022 1952   BILITOT 1.2 10/27/2022 1952   GFRNONAA >60 10/29/2022 0453   Lipase  No results found for: "LIPASE"     Studies/Results: CT ABDOMEN PELVIS W CONTRAST  Result Date: 10/27/2022 CLINICAL DATA:  Lower abdominal pain. EXAM: CT  ABDOMEN AND PELVIS WITH CONTRAST TECHNIQUE: Multidetector CT imaging of the abdomen and pelvis was performed using the standard protocol following bolus administration of intravenous contrast. RADIATION DOSE REDUCTION: This exam was performed according to the departmental dose-optimization program which includes automated exposure control, adjustment of the mA and/or kV according to patient size and/or use of iterative reconstruction technique. CONTRAST:  OMNIPAQUE IOHEXOL 300 MG/ML  SOLN COMPARISON:  None Available. FINDINGS: Lower chest: The lung bases are clear of acute process. No pleural effusion or pulmonary lesions. The heart is normal in size. No pericardial effusion. The distal esophagus and aorta are unremarkable. Hepatobiliary: No focal hepatic lesions or intrahepatic biliary dilatation. The gallbladder is normal. No common bile duct dilatation. Pancreas: Normal Spleen: Within normal limits in size.  No splenic lesions. Adrenals/Urinary Tract: Adrenal glands and kidneys are. There is a small fatty lesion projecting off the medial aspect of the lower pole region of the left kidney. 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 Measures a maximum of 18 mm. The bladder is unremarkable. Stomach/Bowel: The stomach, duodenum and small bowel are unremarkable. No acute inflammatory process, mass lesions or obstructive findings. The terminal ileum is normal and the appendix is normal. Focal severe acute diverticulitis involving the mid sigmoid colon with marked wall thickening  and submucosal edema. Suspect small intramural abscess measuring 14 mm. There is also a small adjacent diverticular abscess measuring 14 mm. Significant sigmoid colon diverticulosis. The remainder of the colon is unremarkable. Vascular/Lymphatic: The aorta is normal in caliber. No dissection. The branch vessels are patent. The major venous structures are patent. No mesenteric or retroperitoneal mass or  adenopathy. Small scattered lymph nodes are noted. Reproductive: The uterus and ovaries are unremarkable. Other: Marked inflammatory phlegmon in the pelvis but no significant fluid collection or drainable abscess. Musculoskeletal: No significant bony findings. IMPRESSION: 1. Focal severe acute diverticulitis involving the mid sigmoid colon with marked wall thickening and submucosal edema. Suspect small intramural abscess measuring 14 mm. There is also a small adjacent diverticular abscess measuring 14 mm. 2. Marked inflammatory phlegmon in the pelvis but no significant fluid collection or drainable abscess. 3. No other significant abdominal/pelvic findings. These results will be called to the ordering clinician or representative by the Radiologist Assistant, and communication documented in the PACS or [employer]. Electronically Signed   By: Rudie Meyer M.D.   On: 10/27/2022 16:43    Anti-infectives: Anti-infectives (From admission, onward)    Start     Dose/Rate Route Frequency Ordered Stop   10/28/22 0300  piperacillin-tazobactam (ZOSYN) IVPB 3.375 g        3.375 g 12.5 mL/hr over 240 Minutes Intravenous Every 8 hours 10/27/22 2144     10/27/22 2015  piperacillin-tazobactam (ZOSYN) IVPB 3.375 g        3.375 g 100 mL/hr over 30 Minutes Intravenous  Once 10/27/22 2000 10/27/22 2106   10/27/22 2015  metroNIDAZOLE (FLAGYL) IVPB 500 mg  Status:  Discontinued        500 mg 100 mL/hr over 60 Minutes Intravenous Every 12 hours 10/27/22 2000 10/27/22 2144        Assessment/Plan  Acute sigmoid diverticulitis with very small intra-mural and peri-diverticular abscesses - afebrile, VSS, WBC WNL - improving with IV abx. Advance to soft diet. Will discuss timing of discharge with Dr. Magnus Ivan - this afternoon vs tomorrow on PO abx for total of 14 days.  - she will need an outpatient colonoscopy in about 6 weeks (has seen Dr. Loreta Ave in the past), and I will discuss appropriateness of colorectal follow  up with one of our CRS.      LOS: 2 days   I reviewed nursing notes, hospitalist notes, last 24 h vitals and pain scores, last 48 h intake and output, last 24 h labs and trends, and last 24 h imaging results.  This care required straight-forward level of medical decision making.   Hosie Spangle, PA-C [facility] Please see Amion for pager number during day hours 7:00am-4:30pm

## 2022-10-29 NOTE — Progress Notes (Signed)
PROGRESS NOTE  Ariel Nicholson WJX:[id number] DOB: 03-07-1961 DOA: 10/27/2022 PCP: Cleatis Polka., MD  HPI/Recap of past 66 hours: 62 year old female with history of HTN and anxiety. Presents with generalized abdominal pain, greatest in the suprapubic area X 3 days. She denies any fevers, chills, nausea, vomiting or diarrhea. Her last colonoscopy was approximately 9 years ago. Nothing significant was found. She has no prior history of diverticulitis.  CT abdomen/pelvis showed focal severe acute diverticulitis, with noted very small abscess, not drainable.  General surgery consulted.  Patient admitted for further management.   Today, patient denies any new complaints.   Assessment/Plan: Principal Problem:   Acute diverticulitis Active Problems:   HTN (hypertension)   Hyperlipidemia   Anxiety   Acute diverticulitis with very small intramural and peridiverticular abscess Currently afebrile, with no leukocytosis CT abdomen showed above General surgery consulted, no plans for surgery for now Continue IV Zosyn Advance diet Pain management, antiemetic as needed  Hypokalemia Replace as needed   HTN Avapro resumed   HLD Crestor resumed   Anxiety Resume Celexa and Klonopin    Estimated body mass index is 26.29 kg/m as calculated from the following:   Height as of 07/09/22: 5' 6.5" (1.689 m).   Weight as of this encounter: 75 kg.     Code Status: Full  Family Communication: None at bedside  Disposition Plan: Status is: Inpatient Remains inpatient appropriate because: Level of care      Consultants: General surgery  Procedures: None  Antimicrobials: IV Zosyn  DVT prophylaxis: Lovenox   Objective: Vitals:   10/28/22 2037 10/29/22 0500 10/29/22 1306 10/29/22 1452  BP: 115/80 129/82 119/75   Pulse: 62 61 65   Resp: 18 18 18    Temp: 98.5 F (36.9 C) 97.9 F (36.6 C) 98 F (36.7 C)   TempSrc: Oral Oral Oral Oral  SpO2: 100% 100% 100%    Weight:        Intake/Output Summary (Last 24 hours) at 10/29/2022 1720 Last data filed at 10/29/2022 1715 Gross per 24 hour  Intake 2070 ml  Output 2200 ml  Net -130 ml   Filed Weights   10/27/22 1821  Weight: 75 kg    Exam: General: NAD  Cardiovascular: S1, S2 present Respiratory: CTAB Abdomen: Soft, nontender, nondistended, bowel sounds present Musculoskeletal: No bilateral pedal edema noted Skin: Normal Psychiatry: Normal mood    Data Reviewed: CBC: Recent Labs  Lab 10/27/22 1952 10/28/22 0034 10/29/22 0453  WBC 5.0 4.8 3.8*  NEUTROABS 3.4 3.1 2.3  HGB 11.9* 11.9* 12.4  HCT 36.1 35.9* 37.9  MCV 91.2 91.3 92.0  PLT 159 153 163   Basic Metabolic Panel: Recent Labs  Lab 10/27/22 1952 10/28/22 0034 10/29/22 0453  NA 136 135 139  K 3.4* 3.3* 4.7  CL 104 103 105  CO2 24 23 27   GLUCOSE 85 97 107*  BUN 11 10 8   CREATININE 0.66 0.57 0.84  CALCIUM 8.8* 8.6* 9.0  MG  --  2.2  --    GFR: Estimated Creatinine Clearance: 73.6 mL/min (by C-G formula based on SCr of 0.84 mg/dL). Liver Function Tests: Recent Labs  Lab 10/27/22 1952  AST 19  ALT 16  ALKPHOS 31*  BILITOT 1.2  PROT 6.7  ALBUMIN 3.8   No results for input(s): "LIPASE", "AMYLASE" in the last 168 hours. No results for input(s): "AMMONIA" in the last 168 hours. Coagulation Profile: No results for input(s): "INR", "PROTIME" in the last 168 hours. Cardiac Enzymes:  No results for input(s): "CKTOTAL", "CKMB", "CKMBINDEX", "TROPONINI" in the last 168 hours. BNP (last 3 results) No results for input(s): "PROBNP" in the last 8760 hours. HbA1C: No results for input(s): "HGBA1C" in the last 72 hours. CBG: No results for input(s): "GLUCAP" in the last 168 hours. Lipid Profile: No results for input(s): "CHOL", "HDL", "LDLCALC", "TRIG", "CHOLHDL", "LDLDIRECT" in the last 72 hours. Thyroid Function Tests: No results for input(s): "TSH", "T4TOTAL", "FREET4", "T3FREE", "THYROIDAB" in the last 72  hours. Anemia Panel: No results for input(s): "VITAMINB12", "FOLATE", "FERRITIN", "TIBC", "IRON", "RETICCTPCT" in the last 72 hours. Urine analysis:    Component Value Date/Time   COLORURINE STRAW (A) 10/27/2022 2152   APPEARANCEUR CLEAR 10/27/2022 2152   LABSPEC 1.034 (H) 10/27/2022 2152   PHURINE 6.0 10/27/2022 2152   GLUCOSEU NEGATIVE 10/27/2022 2152   HGBUR NEGATIVE 10/27/2022 2152   BILIRUBINUR NEGATIVE 10/27/2022 2152   BILIRUBINUR neg 01/09/2014 1525   KETONESUR NEGATIVE 10/27/2022 2152   PROTEINUR NEGATIVE 10/27/2022 2152   UROBILINOGEN negative 01/09/2014 1525   NITRITE NEGATIVE 10/27/2022 2152   LEUKOCYTESUR NEGATIVE 10/27/2022 2152   Sepsis Labs: @LABRCNTIP (procalcitonin:4,lacticidven:4)  ) Recent Results (from the past 240 hour(s))  Blood culture (routine x 2)     Status: None (Preliminary result)   Collection Time: 10/27/22  8:25 PM   Specimen: BLOOD RIGHT HAND  Result Value Ref Range Status   Specimen Description   Final    BLOOD RIGHT HAND Performed at [facility], [address], [place], [place] [address]    Special Requests   Final    BOTTLES DRAWN AEROBIC AND ANAEROBIC Blood Culture adequate volume Performed at [facility], [address] [place], [place] [address]    Culture   Final    NO GROWTH 2 DAYS Performed at [facility], [address] [place], [place] [address]    Report Status PENDING  Incomplete  Blood culture (routine x 2)     Status: None (Preliminary result)   Collection Time: 10/27/22  8:37 PM   Specimen: BLOOD  Result Value Ref Range Status   Specimen Description   Final    BLOOD RIGHT ANTECUBITAL Performed at [facility], [address] [place], [place] [address]    Special Requests   Final    BOTTLES DRAWN AEROBIC AND ANAEROBIC Blood Culture adequate volume Performed at [facility], [address] [place], [place] [address]    Culture   Final    NO  GROWTH 2 DAYS Performed at [facility], [address], [place], [place] [address]    Report Status PENDING  Incomplete      Studies: No results found.  Scheduled Meds:  citalopram  40 mg Oral QPM   enoxaparin (LOVENOX) injection  40 mg Subcutaneous Q24H   irbesartan  150 mg Oral QPM   rosuvastatin  10 mg Oral QPM    Continuous Infusions:  piperacillin-tazobactam 3.375 g (10/29/22 1046)     LOS: 2 days     Briant Cedar, MD [facility]  If 7PM-7AM, please contact night-coverage [email or web address] 10/29/2022, 5:20 PM

## 2022-10-30 DIAGNOSIS — K5792 Diverticulitis of intestine, part unspecified, without perforation or abscess without bleeding: Secondary | ICD-10-CM | POA: Diagnosis not present

## 2022-10-30 LAB — CBC WITH DIFFERENTIAL/PLATELET
Abs Immature Granulocytes: 0.01 10*3/uL (ref 0.00–0.07)
Basophils Absolute: 0 10*3/uL (ref 0.0–0.1)
Basophils Relative: 1 %
Eosinophils Absolute: 0.1 10*3/uL (ref 0.0–0.5)
Eosinophils Relative: 3 %
HCT: 38.8 % (ref 36.0–46.0)
Hemoglobin: 12.4 g/dL (ref 12.0–15.0)
Immature Granulocytes: 0 %
Lymphocytes Relative: 26 %
Lymphs Abs: 0.9 10*3/uL (ref 0.7–4.0)
MCH: 29.7 pg (ref 26.0–34.0)
MCHC: 32 g/dL (ref 30.0–36.0)
MCV: 93 fL (ref 80.0–100.0)
Monocytes Absolute: 0.3 10*3/uL (ref 0.1–1.0)
Monocytes Relative: 9 %
Neutro Abs: 2.3 10*3/uL (ref 1.7–7.7)
Neutrophils Relative %: 61 %
Platelets: 180 10*3/uL (ref 150–400)
RBC: 4.17 MIL/uL (ref 3.87–5.11)
RDW: 12.4 % (ref 11.5–15.5)
WBC: 3.7 10*3/uL — ABNORMAL LOW (ref 4.0–10.5)
nRBC: 0 % (ref 0.0–0.2)

## 2022-10-30 LAB — BASIC METABOLIC PANEL
Anion gap: 7 (ref 5–15)
BUN: 13 mg/dL (ref 8–23)
CO2: 25 mmol/L (ref 22–32)
Calcium: 8.8 mg/dL — ABNORMAL LOW (ref 8.9–10.3)
Chloride: 104 mmol/L (ref 98–111)
Creatinine, Ser: 0.89 mg/dL (ref 0.44–1.00)
GFR, Estimated: >60 mL/min (ref >60)
Glucose, Bld: 113 mg/dL — ABNORMAL HIGH (ref 70–99)
Potassium: 4.2 mmol/L (ref 3.5–5.1)
Sodium: 136 mmol/L (ref 135–145)

## 2022-10-30 MED ORDER — AMOXICILLIN-POT CLAVULANATE 875-125 MG PO TABS: 1.0000 | ORAL_TABLET | Freq: Two times a day (BID) | ORAL | 0 refills | Status: AC

## 2022-10-30 MED ORDER — POLYETHYLENE GLYCOL 3350 17 G PO PACK: 17.0000 g | PACK | Freq: Every day | ORAL | 0 refills | Status: AC | PRN

## 2022-10-30 NOTE — Progress Notes (Signed)
[facility] Progress Note     Subjective: CC:  Continues to feel better. Tolerating soft diet. Wants to go home.   Objective: Vital signs in last 24 hours: Temp:  [97.9 F (36.6 C)-98 F (36.7 C)] 98 F (36.7 C) (05/24 0557) Pulse Rate:  [65-66] 65 (05/24 0557) Resp:  [17-18] 18 (05/24 0557) BP: (113-135)/(75-87) 135/87 (05/24 0557) SpO2:  [97 %-100 %] 98 % (05/24 0557) Last BM Date : 10/29/22  Intake/Output from previous day: 05/23 0701 - 05/24 0700 In: 1710 [P.O.:1560; IV Piggyback:150] Out: 900 [Urine:900] Intake/Output this shift: No intake/output data recorded.  PE: Gen:  Alert, NAD, pleasant Card:  Regular rate and rhythm Pulm:  Normal effort ORA Abd: Soft, non-tender, non-distended, +BS, no hernias or masses Skin: warm and dry, no rashes  Psych: A&Ox3   Lab Results:  Recent Labs    10/29/22 0453 10/30/22 0454  WBC 3.8* 3.7*  HGB 12.4 12.4  HCT 37.9 38.8  PLT 163 180   BMET Recent Labs    10/29/22 0453 10/30/22 0454  NA 139 136  K 4.7 4.2  CL 105 104  CO2 27 25  GLUCOSE 107* 113*  BUN 8 13  CREATININE 0.84 0.89  CALCIUM 9.0 8.8*   PT/INR No results for input(s): "LABPROT", "INR" in the last 72 hours. CMP     Component Value Date/Time   NA 136 10/30/2022 0454   K 4.2 10/30/2022 0454   CL 104 10/30/2022 0454   CO2 25 10/30/2022 0454   GLUCOSE 113 (H) 10/30/2022 0454   BUN 13 10/30/2022 0454   CREATININE 0.89 10/30/2022 0454   CREATININE 0.87 01/12/2014 0942   CALCIUM 8.8 (L) 10/30/2022 0454   PROT 6.7 10/27/2022 1952   ALBUMIN 3.8 10/27/2022 1952   AST 19 10/27/2022 1952   ALT 16 10/27/2022 1952   ALKPHOS 31 (L) 10/27/2022 1952   BILITOT 1.2 10/27/2022 1952   GFRNONAA >60 10/30/2022 0454   Lipase  No results found for: "LIPASE"     Studies/Results: No results found.  Anti-infectives: Anti-infectives (From admission, onward)    Start     Dose/Rate Route Frequency Ordered Stop   10/28/22 0300   piperacillin-tazobactam (ZOSYN) IVPB 3.375 g        3.375 g 12.5 mL/hr over 240 Minutes Intravenous Every 8 hours 10/27/22 2144     10/27/22 2015  piperacillin-tazobactam (ZOSYN) IVPB 3.375 g        3.375 g 100 mL/hr over 30 Minutes Intravenous  Once 10/27/22 2000 10/27/22 2106   10/27/22 2015  metroNIDAZOLE (FLAGYL) IVPB 500 mg  Status:  Discontinued        500 mg 100 mL/hr over 60 Minutes Intravenous Every 12 hours 10/27/22 2000 10/27/22 2144        Assessment/Plan  Acute sigmoid diverticulitis with very small intra-mural and peri-diverticular abscesses - afebrile, VSS, WBC WNL - improving with IV abx.  - stable for discharge. We discussed dietary recommendations. - she will need an outpatient colonoscopy in about 6 weeks (has seen Dr. Loreta Ave in the past), and can follow up with one of our colorectal surgeons PRN. Her case was reviewed with Dr. Romie Levee, colorectal surgeon.      LOS: 3 days   I reviewed nursing notes, hospitalist notes, last 24 h vitals and pain scores, last 48 h intake and output, last 24 h labs and trends, and last 24 h imaging results.  This care required straight-forward level of medical decision making.  Hosie Spangle, PA-C [facility] Please see Amion for pager number during day hours 7:00am-4:30pm

## 2022-10-30 NOTE — Discharge Summary (Signed)
Physician Discharge Summary   Patient: Ariel Nicholson MRN: [id number] DOB: Apr 05, 1961  Admit date:     10/27/2022  Discharge date: 10/30/2022  Discharge Physician: Briant Cedar   PCP: Cleatis Polka., MD   Recommendations at discharge:   Follow-up with PCP in 1 week Follow-up with general surgery as scheduled Follow-up with GI  Discharge Diagnoses: Principal Problem:   Acute diverticulitis Active Problems:   HTN (hypertension)   Hyperlipidemia   Anxiety    Hospital Course: 62 year old female with history of HTN and anxiety. Presents with generalized abdominal pain, greatest in the suprapubic area X 3 days. She denies any fevers, chills, nausea, vomiting or diarrhea. Her last colonoscopy was approximately 9 years ago. Nothing significant was found. She has no prior history of diverticulitis. CT abdomen/pelvis showed focal severe acute diverticulitis, with noted very small abscess, not drainable. General surgery consulted. Patient admitted for further management.     Today, patient denied any new complaints.  Denies any abdominal pain, nausea/vomiting.  Tolerating her diet.  Advised to follow-up with PCP, general surgery, GI.    Assessment and Plan:  Acute diverticulitis with very small intramural and peridiverticular abscess Currently afebrile, with no leukocytosis CT abdomen showed above General surgery consulted S/p IV Zosyn---> patient to p.o. Augmentin for total of 14 days Follow-up with general surgery, GI   Hypokalemia Replaced as needed   HTN Avapro resumed   HLD Crestor resumed   Anxiety Resume Celexa and Klonopin    Consultants: Gen surg Procedures performed: None Disposition: Home Diet recommendation:  Cardiac diet   DISCHARGE MEDICATION: Allergies as of 10/30/2022       Reactions   Sulfamethoxazole-trimethoprim Hives, Other (See Comments)   Sulfa Antibiotics Other (See Comments)   Cephalexin Hives, Other (See Comments)         Medication List     TAKE these medications    amoxicillin-clavulanate 875-125 MG tablet Commonly known as: AUGMENTIN Take 1 tablet by mouth 2 (two) times daily for 10 days.   citalopram 40 MG tablet Commonly known as: CELEXA Take 40 mg by mouth every evening.   irbesartan 150 MG tablet Commonly known as: AVAPRO Take 150 mg by mouth every evening.   polyethylene glycol 17 g packet Commonly known as: MiraLax Take 17 g by mouth daily as needed.   rosuvastatin 10 MG tablet Commonly known as: CRESTOR Take 10 mg by mouth every evening.        Follow-up Information     Sur[facility], [facility]. Call.   Specialty: General Surgery Why: As needed. to discuss surgery. Contact information: [address] [place] [address] [phone]         Charna Elizabeth, MD. Schedule an appointment as soon as possible for a visit in 4 week(s).   Specialty: Gastroenterology Why: for colonoscopy after diverticulitis. Contact information: [address] [place] [address] [phone]                Discharge Exam: Filed Weights   10/27/22 1821  Weight: 75 kg   General: NAD  Cardiovascular: S1, S2 present Respiratory: CTAB Abdomen: Soft, nontender, nondistended, bowel sounds present Musculoskeletal: No bilateral pedal edema noted Skin: Normal Psychiatry: Normal mood   Condition at discharge: stable  The results of significant diagnostics from this hospitalization (including imaging, microbiology, ancillary and laboratory) are listed below for reference.   Imaging Studies: CT ABDOMEN PELVIS W CONTRAST  Result Date: 10/27/2022 CLINICAL DATA:  Lower  abdominal pain. EXAM: CT ABDOMEN AND PELVIS WITH CONTRAST TECHNIQUE: Multidetector CT imaging of the abdomen and pelvis was performed using the standard protocol following bolus administration of intravenous contrast. RADIATION DOSE REDUCTION: This exam was performed according to the  departmental dose-optimization program which includes automated exposure control, adjustment of the mA and/or kV according to patient size and/or use of iterative reconstruction technique. CONTRAST:  OMNIPAQUE IOHEXOL 300 MG/ML  SOLN COMPARISON:  None Available. FINDINGS: Lower chest: The lung bases are clear of acute process. No pleural effusion or pulmonary lesions. The heart is normal in size. No pericardial effusion. The distal esophagus and aorta are unremarkable. Hepatobiliary: No focal hepatic lesions or intrahepatic biliary dilatation. The gallbladder is normal. No common bile duct dilatation. Pancreas: Normal Spleen: Within normal limits in size.  No splenic lesions. Adrenals/Urinary Tract: Adrenal glands and kidneys are. There is a small fatty lesion projecting off the medial aspect of the lower pole region of the left kidney. 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 Measures a maximum of 18 mm. The bladder is unremarkable. Stomach/Bowel: The stomach, duodenum and small bowel are unremarkable. No acute inflammatory process, mass lesions or obstructive findings. The terminal ileum is normal and the appendix is normal. Focal severe acute diverticulitis involving the mid sigmoid colon with marked wall thickening and submucosal edema. Suspect small intramural abscess measuring 14 mm. There is also a small adjacent diverticular abscess measuring 14 mm. Significant sigmoid colon diverticulosis. The remainder of the colon is unremarkable. Vascular/Lymphatic: The aorta is normal in caliber. No dissection. The branch vessels are patent. The major venous structures are patent. No mesenteric or retroperitoneal mass or adenopathy. Small scattered lymph nodes are noted. Reproductive: The uterus and ovaries are unremarkable. Other: Marked inflammatory phlegmon in the pelvis but no significant fluid collection or drainable abscess. Musculoskeletal: No significant bony findings.  IMPRESSION: 1. Focal severe acute diverticulitis involving the mid sigmoid colon with marked wall thickening and submucosal edema. Suspect small intramural abscess measuring 14 mm. There is also a small adjacent diverticular abscess measuring 14 mm. 2. Marked inflammatory phlegmon in the pelvis but no significant fluid collection or drainable abscess. 3. No other significant abdominal/pelvic findings. These results will be called to the ordering clinician or representative by the Radiologist Assistant, and communication documented in the PACS or [employer]. Electronically Signed   By: Rudie Meyer M.D.   On: 10/27/2022 16:43    Microbiology: Results for orders placed or performed during the hospital encounter of 10/27/22  Blood culture (routine x 2)     Status: None (Preliminary result)   Collection Time: 10/27/22  8:25 PM   Specimen: BLOOD RIGHT HAND  Result Value Ref Range Status   Specimen Description   Final    BLOOD RIGHT HAND Performed at [facility], [address] [place], [place] [address]    Special Requests   Final    BOTTLES DRAWN AEROBIC AND ANAEROBIC Blood Culture adequate volume Performed at [facility], [address] [place], [place] [address]    Culture   Final    NO GROWTH 3 DAYS Performed at [facility], [address] [place], [place] [address]    Report Status PENDING  Incomplete  Blood culture (routine x 2)     Status: None (Preliminary result)   Collection Time: 10/27/22  8:37 PM   Specimen: BLOOD  Result Value Ref Range Status   Specimen Description  Final    BLOOD RIGHT ANTECUBITAL Performed at [facility], [address] [place], [place] [address]    Special Requests   Final    BOTTLES DRAWN AEROBIC AND ANAEROBIC Blood Culture adequate volume Performed at [facility], [address] [place], [place] [address]    Culture   Final    NO GROWTH 3 DAYS Performed at [facility], [address] [place], [place] [address]    Report Status PENDING  Incomplete    Labs: CBC: Recent Labs  Lab 10/27/22 1952 10/28/22 0034 10/29/22 0453 10/30/22 0454  WBC 5.0 4.8 3.8* 3.7*  NEUTROABS 3.4 3.1 2.3 2.3  HGB 11.9* 11.9* 12.4 12.4  HCT 36.1 35.9* 37.9 38.8  MCV 91.2 91.3 92.0 93.0  PLT 159 153 163 180   Basic Metabolic Panel: Recent Labs  Lab 10/27/22 1952 10/28/22 0034 10/29/22 0453 10/30/22 0454  NA 136 135 139 136  K 3.4* 3.3* 4.7 4.2  CL 104 103 105 104  CO2 24 23 27 25   GLUCOSE 85 97 107* 113*  BUN 11 10 8 13   CREATININE 0.66 0.57 0.84 0.89  CALCIUM 8.8* 8.6* 9.0 8.8*  MG  --  2.2  --   --    Liver Function Tests: Recent Labs  Lab 10/27/22 1952  AST 19  ALT 16  ALKPHOS 31*  BILITOT 1.2  PROT 6.7  ALBUMIN 3.8   CBG: No results for input(s): "GLUCAP" in the last 168 hours.  Discharge time spent: greater than 30 minutes.  Signed: Briant Cedar, MD [facility] 10/30/2022

## 2022-10-30 NOTE — Progress Notes (Signed)
Pt was discharged home today. Instructions were reviewed with patient, and questions were answered. Pt was taken to main entrance via wheelchair by NT.  

## 2022-10-30 NOTE — TOC CM/SW Note (Signed)
Transition of Care [facility]) Screening Note  Patient Details  Name: Ariel Nicholson Date of Birth: [birth year]  Transition of Care [facility]) CM/SW Contact:    Ewing Schlein, LCSW Phone Number: 10/30/2022, 10:27 AM  Transition of Care Department [facility]) has reviewed patient and no TOC needs have been identified at this time. We will continue to monitor patient advancement through interdisciplinary progression rounds. If new patient transition needs arise, please place a TOC consult.

## 2022-10-30 NOTE — Plan of Care (Signed)

## 2022-11-01 LAB — CULTURE, BLOOD (ROUTINE X 2)
Culture: NO GROWTH
Culture: NO GROWTH
Special Requests: ADEQUATE
Special Requests: ADEQUATE

## 2023-01-13 LAB — HM COLONOSCOPY

## 2024-04-18 LAB — HM MAMMOGRAPHY

## 2024-04-20 ENCOUNTER — Encounter: Payer: Self-pay | Admitting: Obstetrics and Gynecology

## 2024-04-20 ENCOUNTER — Ambulatory Visit: Payer: Self-pay | Admitting: Obstetrics and Gynecology
